# Patient Record
Sex: Female | Born: 1990 | Race: White | Hispanic: No | Marital: Married | Smoking: Former smoker
Health system: Southern US, Community
[De-identification: ages and names within clinical notes are randomized; demographics above are authoritative.]

## PROBLEM LIST (undated history)

## (undated) DIAGNOSIS — O24419 Gestational diabetes mellitus in pregnancy, unspecified control: Secondary | ICD-10-CM

## (undated) DIAGNOSIS — I10 Essential (primary) hypertension: Secondary | ICD-10-CM

## (undated) DIAGNOSIS — B009 Herpesviral infection, unspecified: Secondary | ICD-10-CM

## (undated) HISTORY — DX: Gestational diabetes mellitus in pregnancy, unspecified control: O24.419

## (undated) HISTORY — PX: CHOLECYSTECTOMY: SHX55

## (undated) HISTORY — DX: Herpesviral infection, unspecified: B00.9

## (undated) HISTORY — PX: TONSILLECTOMY: SUR1361

## (undated) HISTORY — DX: Essential (primary) hypertension: I10

---
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
## 2008-12-06 ENCOUNTER — Emergency Department (HOSPITAL_COMMUNITY): Admission: EM | Admit: 2008-12-06 | Discharge: 2008-12-06 | Payer: Self-pay | Admitting: Family Medicine

## 2010-01-08 ENCOUNTER — Emergency Department (HOSPITAL_COMMUNITY): Admission: EM | Admit: 2010-01-08 | Discharge: 2010-01-08 | Payer: Self-pay | Admitting: Emergency Medicine

## 2010-01-11 ENCOUNTER — Encounter: Admission: RE | Admit: 2010-01-11 | Discharge: 2010-01-11 | Payer: Self-pay | Admitting: Gastroenterology

## 2010-01-16 ENCOUNTER — Ambulatory Visit (HOSPITAL_COMMUNITY): Admission: RE | Admit: 2010-01-16 | Discharge: 2010-01-16 | Payer: Self-pay | Admitting: Gastroenterology

## 2010-08-19 ENCOUNTER — Encounter: Payer: Self-pay | Admitting: Sports Medicine

## 2010-11-06 LAB — CBC
MCHC: 35 g/dL (ref 30.0–36.0)
RDW: 12.4 % (ref 11.5–15.5)

## 2010-11-06 LAB — POCT I-STAT, CHEM 8
Chloride: 108 mEq/L (ref 96–112)
HCT: 44 % (ref 36.0–46.0)
Potassium: 3.8 mEq/L (ref 3.5–5.1)

## 2010-11-06 LAB — POCT URINALYSIS DIP (DEVICE)
Glucose, UA: NEGATIVE mg/dL
Nitrite: NEGATIVE
Urobilinogen, UA: 1 mg/dL (ref 0.0–1.0)
pH: 6.5 (ref 5.0–8.0)

## 2010-11-06 LAB — DIFFERENTIAL
Basophils Absolute: 0 10*3/uL (ref 0.0–0.1)
Basophils Relative: 0 % (ref 0–1)
Neutro Abs: 19.3 10*3/uL — ABNORMAL HIGH (ref 1.7–7.7)
Neutrophils Relative %: 90 % — ABNORMAL HIGH (ref 43–77)

## 2010-12-22 ENCOUNTER — Inpatient Hospital Stay (HOSPITAL_COMMUNITY)
Admission: RE | Admit: 2010-12-22 | Discharge: 2010-12-25 | DRG: 775 | Disposition: A | Discharge status: Home / Self Care | Payer: Medicaid Other | Source: Ambulatory Visit | Attending: Obstetrics and Gynecology | Admitting: Obstetrics and Gynecology

## 2010-12-22 LAB — CBC
HCT: 40.4 % (ref 36.0–46.0)
Hemoglobin: 13.7 g/dL (ref 12.0–15.0)
RDW: 13.1 % (ref 11.5–15.5)
WBC: 15.3 10*3/uL — ABNORMAL HIGH (ref 4.0–10.5)

## 2010-12-24 LAB — CBC
MCV: 92.6 fL (ref 78.0–100.0)
Platelets: 238 10*3/uL (ref 150–400)
RBC: 3.92 MIL/uL (ref 3.87–5.11)
WBC: 17.1 10*3/uL — ABNORMAL HIGH (ref 4.0–10.5)

## 2011-01-15 ENCOUNTER — Inpatient Hospital Stay (HOSPITAL_COMMUNITY): Admission: RE | Admit: 2011-01-15 | Payer: Self-pay | Source: Ambulatory Visit | Admitting: Obstetrics and Gynecology

## 2011-05-25 IMAGING — NM NM HEPATO W/GB/PHARM/[PERSON_NAME]
2 series · 12 of 12 positions shown · non-contrast
Comparison: Ultrasound 01/11/2010.

CLINICAL DATA: Right upper quadrant abdominal pain and nausea for
6 months.

NUCLEAR MEDICINE HEPATOBILIARY IMAGING WITH GALLBLADDER EF
TECHNIQUE: Sequential images of the abdomen were obtained [DATE]
minutes following intravenous administration of
radiopharmaceutical.  After the slow intravenous infusion of
uCg Cholecystokinin, the gallbladder ejection fraction was
determined.
Radiopharmaceutical:  5.0 mCi Gc-77m Choletec

[he hepatobiliary · 3.43mm/px · 6 of 30 frames shown (1 of 2)]
[frame 3/30]
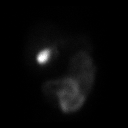
[frame 8/30]
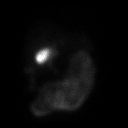
[frame 13/30]
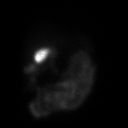
[frame 18/30]
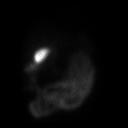
[frame 23/30]
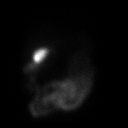
[frame 28/30]
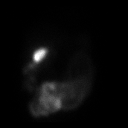

[he hepatobiliary · 3.43mm/px · 6 of 52 frames shown (2 of 2)]
[frame 5/52]
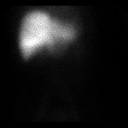
[frame 13/52]
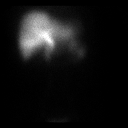
[frame 22/52]
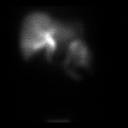
[frame 31/52]
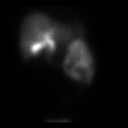
[frame 39/52]
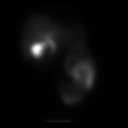
[frame 48/52]
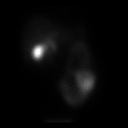

[12 of 12 positions shown; findings below may reference images not displayed]

FINDINGS: Initial images demonstrate homogenous hepatic activity
and prompt visualization of the gallbladder and biliary system.

The stimulated portion of the study demonstrates limited if any
gallbladder contraction there is progressive small bowel activity.
The gallbladder ejection fraction calculated at approximately 30
minutes is 4%.  Normal ejection fractions are considered greater
than 30%.

The patient did not experience symptoms  during CCK administration.
IMPRESSION: 1.  The cystic and common bile ducts are patent.
2.  Limited gallbladder contraction with CCK.  Gallbladder ejection
fraction is 4%.

## 2012-05-04 ENCOUNTER — Emergency Department (HOSPITAL_COMMUNITY)
Admission: EM | Admit: 2012-05-04 | Discharge: 2012-05-04 | Disposition: A | Discharge status: Home / Self Care | Payer: Self-pay | Source: Home / Self Care | Attending: Emergency Medicine | Admitting: Emergency Medicine

## 2012-05-04 ENCOUNTER — Encounter (HOSPITAL_COMMUNITY): Payer: Self-pay

## 2012-05-04 DIAGNOSIS — M549 Dorsalgia, unspecified: Secondary | ICD-10-CM
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
## 2012-05-06 NOTE — ED Provider Notes (Signed)
Medical screening examination/treatment/procedure(s) were performed by non-physician practitioner and as supervising physician I was immediately available for consultation/collaboration.  Marbeth Smedley, M.D.   Shatora Weatherbee C Rajean Desantiago, MD 05/06/12 0747 

## 2018-10-12 ENCOUNTER — Other Ambulatory Visit (HOSPITAL_COMMUNITY)
Admission: RE | Admit: 2018-10-12 | Discharge: 2018-10-12 | Disposition: A | Discharge status: Home / Self Care | Payer: 59 | Source: Ambulatory Visit | Attending: Family Medicine | Admitting: Family Medicine

## 2018-10-12 ENCOUNTER — Other Ambulatory Visit: Payer: Self-pay

## 2018-10-12 ENCOUNTER — Encounter: Payer: Self-pay | Admitting: Family Medicine

## 2018-10-12 ENCOUNTER — Ambulatory Visit (INDEPENDENT_AMBULATORY_CARE_PROVIDER_SITE_OTHER): Payer: 59 | Admitting: Family Medicine

## 2018-10-12 VITALS — BP 133/84 | HR 93 | Ht 64.0 in | Wt 188.6 lb

## 2018-10-12 DIAGNOSIS — Z01419 Encounter for gynecological examination (general) (routine) without abnormal findings: Secondary | ICD-10-CM

## 2018-10-12 DIAGNOSIS — T8332XA Displacement of intrauterine contraceptive device, initial encounter: Secondary | ICD-10-CM

## 2018-10-12 NOTE — Progress Notes (Signed)
    GYNECOLOGY ANNUAL PREVENTATIVE CARE ENCOUNTER NOTE  Subjective:   Meghan Phelps is a 28 y.o. G53P1001 female here for a routine annual gynecologic exam.  Current complaints: none.   Denies abnormal vaginal bleeding, discharge, pelvic pain, problems with intercourse or other gynecologic concerns.    Gynecologic History No LMP recorded (exact date). (Menstrual status: IUD). Contraception: IUD Last Pap: 3 yrs ago- WNL per reports Last mammogram: @40 -50  The following portions of the patient's history were reviewed and updated as appropriate: allergies, current medications, past family history, past medical history, past social history, past surgical history and problem list.  Review of Systems Pertinent items are noted in HPI.   Objective:  BP 133/84 (BP Location: Left Arm, Patient Position: Sitting, Cuff Size: Normal)   Pulse 93   Ht 5\' 4"  (1.626 m)   Wt 188 lb 9.6 oz (85.5 kg)   LMP  (Exact Date)   BMI 32.37 kg/m  CONSTITUTIONAL: Well-developed, well-nourished female in no acute distress.  HENT:  Normocephalic, atraumatic,  Oropharynx is clear and moist EYES:  No scleral icterus.  NECK: Normal range of motion, supple, no masses.  Normal thyroid.  SKIN: Skin is warm and dry. No rash noted. Not diaphoretic. No erythema. No pallor. NEUROLOGIC: Alert and oriented to person, place, and time. Normal reflexes, muscle tone coordination. No cranial nerve deficit noted. PSYCHIATRIC: Normal mood and affect. Normal behavior. Normal judgment and thought content. CARDIOVASCULAR: Normal heart rate noted. 2+ distal pulses. RESPIRATORY: normal WOB. BREASTS: Symmetric in size. No masses, skin changes, nipple drainage, or lymphadenopathy. ABDOMEN: Soft,  no distention noted.  No tenderness, rebound or guarding.  Breast are widely spaced and minimal glandular tissue palpated.  PELVIC: Normal appearing external genitalia; normal appearing vaginal mucosa and cervix.  No abnormal discharge noted.   Pap smear obtained.  Normal uterine size, no other palpable masses, no uterine or adnexal tenderness. Unable to see IUD strings and unable to catch/feel with cytobrush MUSCULOSKELETAL: Normal range of motion.     Assessment and Plan:  1) Annual gynecologic examination with pap smear:  Will follow up results of pap smear and manage accordingly. Declines STI screen.  Routine preventative health maintenance measures emphasized.  2) Contraception counseling: Reviewed all forms of birth control options available including abstinence; over the counter/barrier methods; hormonal contraceptive medication including pill, patch, ring, injection,contraceptive implant; hormonal and nonhormonal IUDs; permanent sterilization options including vasectomy and the various tubal sterilization modalities. Risks and benefits reviewed.  Questions were answered.  Written information was also given to the patient to review.  Patient desires IUD- will continue with this method. Due for removal in 2021. Due to inability to see or feel strings [place] ordered to confirm normal placement. She will follow up in  4-6 wks for [place] and then 1 yr for surveillance.  She was told to call with any further questions, or with any concerns about this method of contraception.  Emphasized use of condoms 100% of the time for STI prevention.   Please refer to After Visit Summary for other counseling recommendations.   Return in about 1 year (around 10/12/2019) for Yearly wellness exam.  Federico Flake, MD, MPH, ABFM Attending Physician Faculty Practice- [facility]

## 2018-10-16 LAB — CYTOLOGY - PAP
Diagnosis: UNDETERMINED — AB
HPV: NOT DETECTED

## 2018-11-05 ENCOUNTER — Other Ambulatory Visit: Payer: Self-pay

## 2018-11-05 ENCOUNTER — Ambulatory Visit (INDEPENDENT_AMBULATORY_CARE_PROVIDER_SITE_OTHER): Payer: 59 | Admitting: Adult Health

## 2018-11-05 ENCOUNTER — Telehealth: Payer: Self-pay | Admitting: Obstetrics & Gynecology

## 2018-11-05 ENCOUNTER — Encounter: Payer: Self-pay | Admitting: Adult Health

## 2018-11-05 VITALS — BP 130/89 | HR 107 | Temp 99.2°F | Ht 64.0 in | Wt 191.0 lb

## 2018-11-05 DIAGNOSIS — B009 Herpesviral infection, unspecified: Secondary | ICD-10-CM | POA: Diagnosis not present

## 2018-11-05 DIAGNOSIS — N9089 Other specified noninflammatory disorders of vulva and perineum: Secondary | ICD-10-CM | POA: Diagnosis not present

## 2018-11-05 MED ORDER — VALACYCLOVIR HCL 1 G PO TABS: 1000.0000 mg | ORAL_TABLET | Freq: Two times a day (BID) | ORAL | 3 refills | Status: DC

## 2018-11-05 MED ORDER — LIDOCAINE 5 % EX OINT
1.0000 | TOPICAL_OINTMENT | CUTANEOUS | 0 refills | Status: DC | PRN
Start: 2018-11-05 — End: 2020-01-05

## 2018-11-05 NOTE — Progress Notes (Signed)
Patient ID: Meghan Phelps, female   DOB: May 20, 1991, 28 y.o.   MRN: [id number] History of Present Illness:  [name] is a 28 year old white female, G1P1, single, worked in with complaints of pain in vaginal area, swollen and raw, and ?hair bumps. Feels achy and has low grade temp. She has Googled her symptoms and thinks it may be herpes. She works at [employer]. No PCP.  Current Medications, Allergies, Past Medical History, Past Surgical History, Family History and Social History were reviewed in [name] record.     Review of Systems: Has pain in vaginal area that about 3 days now and swoleeln and raw, ?hair bumps  Hurts to sit and bend Fells achy and has low grade temp, denies cough, shortness of breath or chest pain.    Physical Exam:BP 130/89 (BP Location: Left Arm, Patient Position: Sitting, Cuff Size: Normal)   Pulse (!) 107   Temp 99.2 F (37.3 C)   Ht 5\' 4"  (1.626 m)   Wt 191 lb (86.6 kg)   BMI 32.79 kg/m  General:  Well developed, well nourished, no acute distress Skin:  Warm and dry Pelvic:  External genitalia is has some folliculitis on mons pubis and lymph nodes are swollen in groin bilaterally.She has vesicles on right labia, with swelling, HSV culture obtained.  The vagina is normal in appearance . Urethra has no lesions or masses. The cervix is bulbous. No IUD strings seen (she has appt 4/28 for [place] to check location) Uterus is felt to be normal size, shape, and contour.  No adnexal masses, mildly tender.Bladder is non tender, no masses felt. Psych:  No mood changes, alert and cooperative,teary today Fall risk is low.  Examination chaperoned by Marchelle Folks Rash LPN. Discussed herpes, will rx valtrex 1 gm bid for 10 days,use peri bottle with warm water when peeing.  Impression: 1. Vulvar irritation -Use peri bottle with warm water whne peeing -wear loose pants at home  -try lidocaine ointment  - Herpes simplex virus culture sent  2. Herpes - Herpes  simplex virus culture sent  Meds ordered this encounter  Medications  . valACYclovir (VALTREX) 1000 MG tablet    Sig: Take 1 tablet (1,000 mg total) by mouth 2 (two) times daily.    Dispense:  20 tablet    Refill:  3    Order Specific Question:   Supervising Provider    Answer:   Despina Hidden, LUTHER H [[id number]]  . lidocaine (XYLOCAINE) 5 % ointment    Sig: Apply 1 application topically as needed.    Dispense:  35.44 g    Refill:  0    Order Specific Question:   Supervising Provider    Answer:   Lazaro Arms [[id number]]     Plan: HSV culture sent Given handout on herpes by Krames too Note given to return to work 11/26/2018 Has appt 4/28 for [place] to check IUD location

## 2018-11-05 NOTE — Telephone Encounter (Signed)
Pt is wanting an appt to be screened for herpes. Please advise.

## 2018-11-05 NOTE — Patient Instructions (Signed)
Genital Herpes °Genital herpes is a common sexually transmitted infection (STI) that is caused by a virus. The virus spreads from person to person through sexual contact. Infection can cause itching, blisters, and sores around the genitals or rectum. Symptoms may last several days and then go away This is called an outbreak. However, the virus remains in your body, so you may have more outbreaks in the future. The time between outbreaks varies and can be months or years. °Genital herpes affects men and women. It is particularly concerning for pregnant women because the virus can be passed to the baby during delivery and can cause serious problems. Genital herpes is also a concern for people who have a weak disease-fighting (immune) system. °What are the causes? °This condition is caused by the herpes simplex virus (HSV) type 1 or type 2. The virus may spread through: °· Sexual contact with an infected person, including vaginal, anal, and oral sex. °· Contact with fluid from a herpes sore. °· The skin. This means that you can get herpes from an infected partner even if he or she does not have a visible sore or does not know that he or she is infected. °What increases the risk? °You are more likely to develop this condition if: °· You have sex with many partners. °· You do not use latex condoms during sex. °What are the signs or symptoms? °Most people do not have symptoms (asymptomatic) or have mild symptoms that may be mistaken for other skin problems. Symptoms may include: °· Small red bumps near the genitals, rectum, or mouth. These bumps turn into blisters and then turn into sores. °· Flu-like symptoms, including: °? Fever. °? Body aches. °? Swollen lymph nodes. °? Headache. °· Painful urination. °· Pain and itching in the genital area or rectal area. °· Vaginal discharge. °· Tingling or shooting pain in the legs and buttocks. °Generally, symptoms are more severe and last longer during the first (primary)  outbreak. Flu-like symptoms are also more common during the primary outbreak. °How is this diagnosed? °Genital herpes may be diagnosed based on: °· A physical exam. °· Your medical history. °· Blood tests. °· A test of a fluid sample (culture) from an open sore. °How is this treated? °There is no cure for this condition, but treatment with antiviral medicines that are taken by mouth (orally) can do the following: °· Speed up healing and relieve symptoms. °· Help to reduce the spread of the virus to sexual partners. °· Limit the chance of future outbreaks, or make future outbreaks shorter. °· Lessen symptoms of future outbreaks. °Your health care provider may also recommend pain relief medicines, such as aspirin or ibuprofen. °Follow these instructions at home: °Sexual activity °· Do not have sexual contact during active outbreaks. °· Practice safe sex. Latex condoms and female condoms may help prevent the spread of the herpes virus. °General instructions °· Keep the affected areas dry and clean. °· Take over-the-counter and prescription medicines only as told by your health care provider. °· Avoid rubbing or touching blisters and sores. If you do touch blisters or sores: °? Wash your hands thoroughly with soap and water. °? Do not touch your eyes afterward. °· To help relieve pain or itching, you may take the following actions as directed by your health care provider: °? Apply a cold, wet cloth (cold compress) to affected areas 4-6 times a day. °? Apply a substance that protects your skin and reduces bleeding (astringent). °? Apply a gel that   helps relieve pain around sores (lidocaine gel). °? Take a warm, shallow bath that cleans the genital area (sitz bath). °· Keep all follow-up visits as told by your health care provider. This is important. °How is this prevented? °· Use condoms. Although anyone can get genital herpes during sexual contact, even with the use of a condom, a condom can provide some  protection. °· Avoid having multiple sexual partners. °· Talk with your sexual partner about any symptoms either of you may have. Also, talk with your partner about any history of STIs. °· Get tested for STIs before you have sex. Ask your partner to do the same. °· Do not have sexual contact if you have symptoms of genital herpes. °Contact a health care provider if: °· Your symptoms are not improving with medicine. °· Your symptoms return. °· You have new symptoms. °· You have a fever. °· You have abdominal pain. °· You have redness, swelling, or pain in your eye. °· You notice new sores on other parts of your body. °· You are a woman and experience bleeding between menstrual periods. °· You have had herpes and you become pregnant or plan to become pregnant. °Summary °· Genital herpes is a common sexually transmitted infection (STI) that is caused by the herpes simplex virus (HSV) type 1 or type 2. °· These viruses are most often spread through sexual contact with an infected person. °· You are more likely to develop this condition if you have sex with many partners or you have unprotected sex. °· Most people do not have symptoms (asymptomatic) or have mild symptoms that may be mistaken for other skin problems. Symptoms occur as outbreaks that may happen months or years apart. °· There is no cure for this condition, but treatment with oral antiviral medicines can reduce symptoms, reduce the chance of spreading the virus to a partner, prevent future outbreaks, or shorten future outbreaks. °This information is not intended to replace advice given to you by your health care provider. Make sure you discuss any questions you have with your health care provider. °Document Released: 07/12/2000 Document Revised: 06/14/2016 Document Reviewed: 06/14/2016 °Elsevier Interactive Patient Education © 2019 [employer] ° °

## 2018-11-05 NOTE — Telephone Encounter (Signed)
Pt thinks she is having hsv outbreak. Scheduled appt for 1045.

## 2018-11-08 LAB — HERPES SIMPLEX VIRUS CULTURE

## 2018-11-09 ENCOUNTER — Encounter: Payer: Self-pay | Admitting: Adult Health

## 2018-11-09 DIAGNOSIS — B009 Herpesviral infection, unspecified: Secondary | ICD-10-CM

## 2018-11-09 HISTORY — DX: Herpesviral infection, unspecified: B00.9

## 2018-11-23 ENCOUNTER — Other Ambulatory Visit: Payer: Self-pay | Admitting: Family Medicine

## 2018-11-23 DIAGNOSIS — T8332XA Displacement of intrauterine contraceptive device, initial encounter: Secondary | ICD-10-CM

## 2018-11-24 ENCOUNTER — Other Ambulatory Visit: Payer: Self-pay

## 2018-11-24 ENCOUNTER — Ambulatory Visit (INDEPENDENT_AMBULATORY_CARE_PROVIDER_SITE_OTHER): Payer: 59

## 2018-11-24 DIAGNOSIS — Z30430 Encounter for insertion of intrauterine contraceptive device: Secondary | ICD-10-CM

## 2018-11-24 DIAGNOSIS — T8332XA Displacement of intrauterine contraceptive device, initial encounter: Secondary | ICD-10-CM | POA: Diagnosis not present

## 2018-11-24 NOTE — Progress Notes (Signed)
PELVIC US TA/TV: homogeneous anteverted uterus,wnl,IUD is centrally located within the endometrium,EEC 4.8 mm,normal ovaries bilat,ovaries appear mobile,no pain during ultrasound

## 2019-01-05 DIAGNOSIS — H5213 Myopia, bilateral: Secondary | ICD-10-CM | POA: Diagnosis not present

## 2019-01-05 DIAGNOSIS — H52222 Regular astigmatism, left eye: Secondary | ICD-10-CM | POA: Diagnosis not present

## 2020-01-05 ENCOUNTER — Other Ambulatory Visit (HOSPITAL_COMMUNITY)
Admission: RE | Admit: 2020-01-05 | Discharge: 2020-01-05 | Disposition: A | Discharge status: Home / Self Care | Payer: 59 | Source: Ambulatory Visit | Attending: Adult Health | Admitting: Adult Health

## 2020-01-05 ENCOUNTER — Encounter: Payer: Self-pay | Admitting: Adult Health

## 2020-01-05 ENCOUNTER — Ambulatory Visit (INDEPENDENT_AMBULATORY_CARE_PROVIDER_SITE_OTHER): Payer: 59 | Admitting: Adult Health

## 2020-01-05 VITALS — BP 138/83 | HR 91 | Ht 64.0 in | Wt 190.5 lb

## 2020-01-05 DIAGNOSIS — Z01419 Encounter for gynecological examination (general) (routine) without abnormal findings: Secondary | ICD-10-CM

## 2020-01-05 DIAGNOSIS — Z3202 Encounter for pregnancy test, result negative: Secondary | ICD-10-CM | POA: Insufficient documentation

## 2020-01-05 DIAGNOSIS — Z1272 Encounter for screening for malignant neoplasm of vagina: Secondary | ICD-10-CM | POA: Diagnosis not present

## 2020-01-05 DIAGNOSIS — Z30432 Encounter for removal of intrauterine contraceptive device: Secondary | ICD-10-CM | POA: Diagnosis not present

## 2020-01-05 DIAGNOSIS — Z30011 Encounter for initial prescription of contraceptive pills: Secondary | ICD-10-CM | POA: Insufficient documentation

## 2020-01-05 LAB — POCT URINE PREGNANCY: Preg Test, Ur: NEGATIVE

## 2020-01-05 MED ORDER — NORETHIN ACE-ETH ESTRAD-FE 1-20 MG-MCG PO TABS: 1.0000 | ORAL_TABLET | Freq: Every day | ORAL | 11 refills | Status: DC

## 2020-01-05 NOTE — Progress Notes (Signed)
Patient ID: Meghan Phelps, female   DOB: 05-27-1991, 29 y.o.   MRN: [id number] History of Present Illness: Meghan Phelps is a 29 year old white female, engaged, in for well woman gyn exam and pap.Her last pap was ASCUS, negative HPV. She wants her IUD removed, she is getting married in October, she wants OCs for now.   Current Medications, Allergies, Past Medical History, Past Surgical History, Family History and Social History were reviewed in Meghan Phelps.     Review of Systems: Patient denies any headaches, hearing loss, fatigue, blurred vision, shortness of breath, chest pain, abdominal pain, problems with bowel movements, urination, or intercourse. No joint pain or mood swings. and pap.Her last pap was ASCUS, negative HPV. She wants her IUD removed, she is getting married in October, she wants OCs for now.   Current Medications, Allergies, Past Medical History, Past Surgical History, Family History and Social History were reviewed in Meghan Phelps.     Review of Systems: Patient denies any headaches, hearing loss, fatigue, blurred vision, shortness of breath, chest pain, abdominal pain, problems with bowel movements, urination, or intercourse. No joint pain or mood swings.    Physical Exam:BP 138/83 (BP Location: Left Arm, Patient Position: Sitting, Cuff Size: Normal)   Pulse 91   Ht 5\' 4"  (1.626 m)   Wt 190 lb 8 oz (86.4 kg)   BMI 32.70 kg/m UPT is negative.Conset signed for IUD removal. General:  Well developed, well nourished, no acute distress Skin:  Warm and dry Neck:  Midline trachea, normal thyroid, good ROM, no lymphadenopathy Lungs; Clear to auscultation bilaterally Breast:  No dominant palpable mass, retraction, or nipple discharge Cardiovascular: Regular rate and rhythm Abdomen:  Soft, non tender, no hepatosplenomegaly Pelvic:  External genitalia is normal in appearance, no lesions.  The vagina is normal in appearance. Urethra has no lesions or masses. The cervix is bulbous. Pap with high risk HPV 16/18 genotyping performed, no IUD strings seen, used forceps to fish to find strings and IUD removed. Uterus is felt to be normal size, shape, and contour.  No adnexal masses or tenderness noted.Bladder is non tender, no masses felt. Extremities/musculoskeletal:  No swelling or varicosities noted, no clubbing or cyanosis Psych:  No mood changes, alert and cooperative,seems happy AA 2 Fall risk is low PHQ 9 score is 6, no SI  Impression and Plan: 1.  Encounter for gynecological examination with Papanicolaou smear of cervix Pap sent Physical in 1 year   2. Pregnancy examination or test, negative result  3. Encounter for IUD removal Had cramping after removal, given water and snack, take advil or tylenol for cramping  4. Encounter for initial prescription of contraceptive pills Will rx junel 1/20, start today, use condoms for 1 pack Meds ordered this encounter  Medications  . norethindrone-ethinyl estradiol (LOESTRIN FE) 1-20 MG-MCG tablet    Sig: Take 1 tablet by mouth daily.    Dispense:  1 Package    Refill:  11    Order Specific Question:   Supervising Provider    Answer:   2/20 H [[id number]]   Follow up in 3 months for ROS and BP check

## 2020-01-07 LAB — CYTOLOGY - PAP
Chlamydia: NEGATIVE
Comment: NEGATIVE
Comment: NEGATIVE
Comment: NORMAL
Diagnosis: REACTIVE
High risk HPV: NEGATIVE
Neisseria Gonorrhea: NEGATIVE

## 2020-01-11 DIAGNOSIS — H5213 Myopia, bilateral: Secondary | ICD-10-CM | POA: Diagnosis not present

## 2020-01-11 DIAGNOSIS — H52222 Regular astigmatism, left eye: Secondary | ICD-10-CM | POA: Diagnosis not present

## 2020-04-06 ENCOUNTER — Ambulatory Visit (INDEPENDENT_AMBULATORY_CARE_PROVIDER_SITE_OTHER): Payer: 59 | Admitting: Adult Health

## 2020-04-06 ENCOUNTER — Encounter: Payer: Self-pay | Admitting: Adult Health

## 2020-04-06 VITALS — BP 134/89 | HR 101 | Ht 64.0 in | Wt 187.0 lb

## 2020-04-06 DIAGNOSIS — Z3041 Encounter for surveillance of contraceptive pills: Secondary | ICD-10-CM

## 2020-04-06 DIAGNOSIS — R030 Elevated blood-pressure reading, without diagnosis of hypertension: Secondary | ICD-10-CM | POA: Diagnosis not present

## 2020-04-06 NOTE — Progress Notes (Signed)
  Subjective:     Patient ID: Meghan Phelps, female   DOB: 26-Jun-1991, 29 y.o.   MRN: [id number]  HPI Meghan Phelps is a 29 year old white female, engaged, getting married next month in for BP check and ROS on starting OCs after IUD removal. She is Charity fundraiser at [employer].  Review of Systems Periods monthly on OCs, first was long, second better, then the third lasted 4 days stopped, then cramped and had clots and bleed through tampon. Has weeding stress  Reviewed past medical,surgical, social and family history. Reviewed medications and allergies.     Objective:   Physical Exam BP 134/89 (BP Location: Right Arm, Patient Position: Sitting, Cuff Size: Normal)   Pulse (!) 101   Ht 5\' 4"  (1.626 m)   Wt 187 lb (84.8 kg)   BMI 32.10 kg/m   Skin warm and dry. Lungs: clear to ausculation bilaterally. Cardiovascular: regular rate and rhythm.     Upstream - 04/06/20 0933      Pregnancy Intention Screening   Does the patient want to become pregnant in the next year? Unsure    Does the patient's partner want to become pregnant in the next year? Unsure    Would the patient like to discuss contraceptive options today? No      Contraception Wrap Up   Current Method Oral Contraceptive    End Method Oral Contraceptive    Contraception Counseling Provided No          Assessment:     1. Encounter for surveillance of contraceptive pills Continue loestrin 1/20  2. Elevated BP without diagnosis of hypertension Keep check on BP, follow up in 3 months     Plan:     Follow up in 3 months

## 2020-07-06 ENCOUNTER — Ambulatory Visit (INDEPENDENT_AMBULATORY_CARE_PROVIDER_SITE_OTHER): Payer: 59 | Admitting: Adult Health

## 2020-07-06 ENCOUNTER — Other Ambulatory Visit: Payer: Self-pay

## 2020-07-06 ENCOUNTER — Encounter: Payer: Self-pay | Admitting: Adult Health

## 2020-07-06 VITALS — BP 140/96 | HR 102 | Ht 64.0 in | Wt 193.0 lb

## 2020-07-06 DIAGNOSIS — I10 Essential (primary) hypertension: Secondary | ICD-10-CM | POA: Insufficient documentation

## 2020-07-06 DIAGNOSIS — F17200 Nicotine dependence, unspecified, uncomplicated: Secondary | ICD-10-CM | POA: Insufficient documentation

## 2020-07-06 DIAGNOSIS — Z319 Encounter for procreative management, unspecified: Secondary | ICD-10-CM | POA: Diagnosis not present

## 2020-07-06 MED ORDER — PRENATAL PLUS 27-1 MG PO TABS: 1.0000 | ORAL_TABLET | Freq: Every day | ORAL | 12 refills | Status: DC

## 2020-07-06 MED ORDER — AMLODIPINE BESYLATE 5 MG PO TABS: 5.0000 mg | ORAL_TABLET | Freq: Every day | ORAL | 3 refills | Status: DC

## 2020-07-06 NOTE — Patient Instructions (Signed)
DASH Eating Plan DASH stands for "Dietary Approaches to Stop Hypertension." The DASH eating plan is a healthy eating plan that has been shown to reduce high blood pressure (hypertension). It may also reduce your risk for type 2 diabetes, heart disease, and stroke. The DASH eating plan may also help with weight loss. What are tips for following this plan?  General guidelines  Avoid eating more than 2,300 mg (milligrams) of salt (sodium) a day. If you have hypertension, you may need to reduce your sodium intake to 1,500 mg a day.  Limit alcohol intake to no more than 1 drink a day for nonpregnant women and 2 drinks a day for men. One drink equals 12 oz of beer, 5 oz of wine, or 1 oz of hard liquor.  Work with your health care provider to maintain a healthy body weight or to lose weight. Ask what an ideal weight is for you.  Get at least 30 minutes of exercise that causes your heart to beat faster (aerobic exercise) most days of the week. Activities may include walking, swimming, or biking.  Work with your health care provider or diet and nutrition specialist (dietitian) to adjust your eating plan to your individual calorie needs. Reading food labels   Check food labels for the amount of sodium per serving. Choose foods with less than 5 percent of the Daily Value of sodium. Generally, foods with less than 300 mg of sodium per serving fit into this eating plan.  To find whole grains, look for the word "whole" as the first word in the ingredient list. Shopping  Buy products labeled as "low-sodium" or "no salt added."  Buy fresh foods. Avoid canned foods and premade or frozen meals. Cooking  Avoid adding salt when cooking. Use salt-free seasonings or herbs instead of table salt or sea salt. Check with your health care provider or pharmacist before using salt substitutes.  Do not fry foods. Cook foods using healthy methods such as baking, boiling, grilling, and broiling instead.  Cook with  heart-healthy oils, such as olive, canola, soybean, or sunflower oil. Meal planning  Eat a balanced diet that includes: ? 5 or more servings of fruits and vegetables each day. At each meal, try to fill half of your plate with fruits and vegetables. ? Up to 6-8 servings of whole grains each day. ? Less than 6 oz of lean meat, poultry, or fish each day. A 3-oz serving of meat is about the same size as a deck of cards. One egg equals 1 oz. ? 2 servings of low-fat dairy each day. ? A serving of nuts, seeds, or beans 5 times each week. ? Heart-healthy fats. Healthy fats called Omega-3 fatty acids are found in foods such as flaxseeds and coldwater fish, like sardines, salmon, and mackerel.  Limit how much you eat of the following: ? Canned or prepackaged foods. ? Food that is high in trans fat, such as fried foods. ? Food that is high in saturated fat, such as fatty meat. ? Sweets, desserts, sugary drinks, and other foods with added sugar. ? Full-fat dairy products.  Do not salt foods before eating.  Try to eat at least 2 vegetarian meals each week.  Eat more home-cooked food and less restaurant, buffet, and fast food.  When eating at a restaurant, ask that your food be prepared with less salt or no salt, if possible. What foods are recommended? The items listed may not be a complete list. Talk with your dietitian about   what dietary choices are best for you. Grains Whole-grain or whole-wheat bread. Whole-grain or whole-wheat pasta. Brown rice. Oatmeal. Quinoa. Bulgur. Whole-grain and low-sodium cereals. Pita bread. Low-fat, low-sodium crackers. Whole-wheat flour tortillas. Vegetables Fresh or frozen vegetables (raw, steamed, roasted, or grilled). Low-sodium or reduced-sodium tomato and vegetable juice. Low-sodium or reduced-sodium tomato sauce and tomato paste. Low-sodium or reduced-sodium canned vegetables. Fruits All fresh, dried, or frozen fruit. Canned fruit in natural juice (without  added sugar). Meat and other protein foods Skinless chicken or turkey. Ground chicken or turkey. Pork with fat trimmed off. Fish and seafood. Egg whites. Dried beans, peas, or lentils. Unsalted nuts, nut butters, and seeds. Unsalted canned beans. Lean cuts of beef with fat trimmed off. Low-sodium, lean deli meat. Dairy Low-fat (1%) or fat-free (skim) milk. Fat-free, low-fat, or reduced-fat cheeses. Nonfat, low-sodium ricotta or cottage cheese. Low-fat or nonfat yogurt. Low-fat, low-sodium cheese. Fats and oils Soft margarine without trans fats. Vegetable oil. Low-fat, reduced-fat, or light mayonnaise and salad dressings (reduced-sodium). Canola, safflower, olive, soybean, and sunflower oils. Avocado. Seasoning and other foods Herbs. Spices. Seasoning mixes without salt. Unsalted popcorn and pretzels. Fat-free sweets. What foods are not recommended? The items listed may not be a complete list. Talk with your dietitian about what dietary choices are best for you. Grains Baked goods made with fat, such as croissants, muffins, or some breads. Dry pasta or rice meal packs. Vegetables Creamed or fried vegetables. Vegetables in a cheese sauce. Regular canned vegetables (not low-sodium or reduced-sodium). Regular canned tomato sauce and paste (not low-sodium or reduced-sodium). Regular tomato and vegetable juice (not low-sodium or reduced-sodium). Pickles. Olives. Fruits Canned fruit in a light or heavy syrup. Fried fruit. Fruit in cream or butter sauce. Meat and other protein foods Fatty cuts of meat. Ribs. Fried meat. Bacon. Sausage. Bologna and other processed lunch meats. Salami. Fatback. Hotdogs. Bratwurst. Salted nuts and seeds. Canned beans with added salt. Canned or smoked fish. Whole eggs or egg yolks. Chicken or turkey with skin. Dairy Whole or 2% milk, cream, and half-and-half. Whole or full-fat cream cheese. Whole-fat or sweetened yogurt. Full-fat cheese. Nondairy creamers. Whipped toppings.  Processed cheese and cheese spreads. Fats and oils Butter. Stick margarine. Lard. Shortening. Ghee. Bacon fat. Tropical oils, such as coconut, palm kernel, or palm oil. Seasoning and other foods Salted popcorn and pretzels. Onion salt, garlic salt, seasoned salt, table salt, and sea salt. Worcestershire sauce. Tartar sauce. Barbecue sauce. Teriyaki sauce. Soy sauce, including reduced-sodium. Steak sauce. Canned and packaged gravies. Fish sauce. Oyster sauce. Cocktail sauce. Horseradish that you find on the shelf. Ketchup. Mustard. Meat flavorings and tenderizers. Bouillon cubes. Hot sauce and Tabasco sauce. Premade or packaged marinades. Premade or packaged taco seasonings. Relishes. Regular salad dressings. Where to find more information:  National Heart, Lung, and Blood Institute: www.nhlbi.nih.gov  American Heart Association: www.heart.org Summary  The DASH eating plan is a healthy eating plan that has been shown to reduce high blood pressure (hypertension). It may also reduce your risk for type 2 diabetes, heart disease, and stroke.  With the DASH eating plan, you should limit salt (sodium) intake to 2,300 mg a day. If you have hypertension, you may need to reduce your sodium intake to 1,500 mg a day.  When on the DASH eating plan, aim to eat more fresh fruits and vegetables, whole grains, lean proteins, low-fat dairy, and heart-healthy fats.  Work with your health care provider or diet and nutrition specialist (dietitian) to adjust your eating plan to your   individual calorie needs. This information is not intended to replace advice given to you by your health care provider. Make sure you discuss any questions you have with your health care provider. Document Revised: 06/27/2017 Document Reviewed: 07/08/2016 Elsevier Patient Education  2020 Elsevier Inc.  

## 2020-07-06 NOTE — Progress Notes (Signed)
  Subjective:     Patient ID: Meghan Phelps, female   DOB: Sep 29, 1990, 29 y.o.   MRN: [id number]  HPI Meghan Phelps is a 29 year old white female,married, in for BP check, she stopped OCs end of October and would lie to get pregnant. She has gotten both COVID vaccines.  Review of Systems Patient denies any headaches, hearing loss, fatigue, blurred vision, shortness of breath, chest pain, abdominal pain, problems with bowel movements, urination, or intercourse. No joint pain or mood swings. Reviewed past medical,surgical, social and family history. Reviewed medications and allergies.     Objective:   Physical Exam BP (!) 140/96 (BP Location: Right Arm, Cuff Size: Normal)   Pulse (!) 102   Ht 5\' 4"  (1.626 m)   Wt 193 lb (87.5 kg)   LMP 06/14/2020   BMI 33.13 kg/m  Skin warm and dry. . Lungs: clear to ausculation bilaterally. Cardiovascular: regular rate and rhythm.     Upstream - 07/06/20 14/09/21      Pregnancy Intention Screening   Does the patient want to become pregnant in the next year? Yes    Does the patient's partner want to become pregnant in the next year? Yes    Would the patient like to discuss contraceptive options today? No      Contraception Wrap Up   Current Method Pregnant/Seeking Pregnancy    End Method Pregnant/Seeking Pregnancy    Contraception Counseling Provided No          Assessment:     1. Hypertension, unspecified type Will rx Norvasc Meds ordered this encounter  Medications  . prenatal vitamin w/FE, FA (PRENATAL 1 + 1) 27-1 MG TABS tablet    Sig: Take 1 tablet by mouth daily at 12 noon.    Dispense:  30 tablet    Refill:  12    Order Specific Question:   Supervising Provider    Answer:   [id number], LUTHER H [[id number]]  . amLODipine (NORVASC) 5 MG tablet    Sig: Take 1 tablet (5 mg total) by mouth daily.    Dispense:  30 tablet    Refill:  3    Order Specific Question:   Supervising Provider    Answer:   Despina Hidden H [[id number]]  Try to lose about 10 lbs Review  DASH diet  Keep check on BP at work   2. Patient desires pregnancy Will rx PNV Will try to get BP better before getting pregnant  3. Smoker Start trying to decrease     Plan:     Follow up in 8 weeks for BP and ROS

## 2020-09-01 ENCOUNTER — Other Ambulatory Visit: Payer: Self-pay

## 2020-09-01 ENCOUNTER — Encounter: Payer: Self-pay | Admitting: Adult Health

## 2020-09-01 ENCOUNTER — Ambulatory Visit (INDEPENDENT_AMBULATORY_CARE_PROVIDER_SITE_OTHER): Payer: 59 | Admitting: Adult Health

## 2020-09-01 VITALS — BP 128/84 | HR 95 | Ht 64.0 in | Wt 182.5 lb

## 2020-09-01 DIAGNOSIS — I10 Essential (primary) hypertension: Secondary | ICD-10-CM

## 2020-09-01 DIAGNOSIS — Z319 Encounter for procreative management, unspecified: Secondary | ICD-10-CM | POA: Diagnosis not present

## 2020-09-01 MED ORDER — AMLODIPINE BESYLATE 5 MG PO TABS: 5.0000 mg | ORAL_TABLET | Freq: Every day | ORAL | 6 refills | Status: DC

## 2020-09-01 NOTE — Progress Notes (Signed)
  Subjective:     Patient ID: Meghan Phelps, female   DOB: March 22, 1991, 30 y.o.   MRN: [id number]  HPI Meghan Phelps is a 30 year old white female, married, G1P1 in for BP check since starting Norvasc, in December. She has bee eating healthier and so has husband and has started decreasing smoking.   Review of Systems  Has lost some weight  Has some discomfort right groin area at times since end of December Reviewed past medical,surgical, social and family history. Reviewed medications and allergies.     Objective:   Physical Exam BP 128/84 (BP Location: Left Arm, Patient Position: Sitting, Cuff Size: Normal)   Pulse 95   Ht 5\' 4"  (1.626 m)   Wt 182 lb 8 oz (82.8 kg)   LMP 08/31/2020   BMI 31.33 kg/m  Skin warm and dry.  Lungs: clear to ausculation bilaterally. Cardiovascular: regular rate and rhythm. Has lost 10 1/2 lbs.   Fall risk is low  Upstream - 09/01/20 0857      Pregnancy Intention Screening   Does the patient want to become pregnant in the next year? Yes    Does the patient's partner want to become pregnant in the next year? Yes    Would the patient like to discuss contraceptive options today? No      Contraception Wrap Up   Current Method Pregnant/Seeking Pregnancy    End Method Pregnant/Seeking Pregnancy    Contraception Counseling Provided No          Assessment:     1. Hypertension, unspecified type,much better now Continue norvasc Meds ordered this encounter  Medications  . amLODipine (NORVASC) 5 MG tablet    Sig: Take 1 tablet (5 mg total) by mouth daily.    Dispense:  30 tablet    Refill:  6    Order Specific Question:   Supervising Provider    Answer:   10/30/20, LUTHER H [[id number]]    2. Patient desires pregnancy Continue PNV,has refills Continue weight loss efforts Continue decreasing smoking    Plan:     Return in June for physical or sooner if pregnant

## 2020-09-29 ENCOUNTER — Encounter: Payer: Self-pay | Admitting: *Deleted

## 2020-09-29 ENCOUNTER — Other Ambulatory Visit: Payer: Self-pay

## 2020-09-29 ENCOUNTER — Ambulatory Visit (INDEPENDENT_AMBULATORY_CARE_PROVIDER_SITE_OTHER): Payer: 59 | Admitting: *Deleted

## 2020-09-29 VITALS — BP 129/79 | HR 87 | Ht 64.0 in | Wt 183.0 lb

## 2020-09-29 DIAGNOSIS — Z3201 Encounter for pregnancy test, result positive: Secondary | ICD-10-CM

## 2020-09-29 LAB — POCT URINE PREGNANCY: Preg Test, Ur: POSITIVE — AB

## 2020-09-29 NOTE — Progress Notes (Signed)
Chart reviewed for nurse visit. Agree with plan of care.  Adline Potter, NP 09/29/2020 1:23 PM

## 2020-09-29 NOTE — Progress Notes (Signed)
   NURSE VISIT- PREGNANCY CONFIRMATION   SUBJECTIVE:  Meghan Phelps is a 30 y.o. G61P1001 female at [redacted]w[redacted]d by certain LMP of Patient's last menstrual period was 08/31/2020. Here for pregnancy confirmation.  Home pregnancy test: positive x 2  She reports no complaints.  She is taking prenatal vitamins.    OBJECTIVE:  BP 129/79 (BP Location: Left Arm, Patient Position: Sitting, Cuff Size: Normal)   Pulse 87   Ht 5\' 4"  (1.626 m)   Wt 183 lb (83 kg)   LMP 08/31/2020   BMI 31.41 kg/m   Appears well, in no apparent distress OB History  Gravida Para Term Preterm AB Living  2 1 1     1   SAB IAB Ectopic Multiple Live Births          1    # Outcome Date GA Lbr Len/2nd Weight Sex Delivery Anes PTL Lv  2 Current           1 Term 12/23/10 [redacted]w[redacted]d   M Vag-Spont   LIV    No results found for this or any previous visit (from the past 24 hour(s)).  ASSESSMENT: Positive pregnancy test, [redacted]w[redacted]d by LMP    PLAN: Schedule for dating ultrasound in 5 weeks Prenatal vitamins: continue   Nausea medicines: not currently needed   OB packet given: Yes  [redacted]w[redacted]d  09/29/2020 12:12 PM

## 2020-10-20 ENCOUNTER — Other Ambulatory Visit: Payer: Self-pay | Admitting: Adult Health

## 2020-10-20 DIAGNOSIS — O209 Hemorrhage in early pregnancy, unspecified: Secondary | ICD-10-CM

## 2020-10-20 NOTE — Progress Notes (Signed)
Will check QHCG as baseline and ABORH

## 2020-10-21 LAB — BETA HCG QUANT (REF LAB): hCG Quant: 2419 m[IU]/mL

## 2020-10-21 LAB — ABO/RH: Rh Factor: POSITIVE

## 2020-10-23 ENCOUNTER — Other Ambulatory Visit: Payer: Self-pay | Admitting: Adult Health

## 2020-10-23 DIAGNOSIS — O209 Hemorrhage in early pregnancy, unspecified: Secondary | ICD-10-CM

## 2020-10-23 NOTE — Progress Notes (Signed)
Ck QHCG today  

## 2020-10-24 LAB — BETA HCG QUANT (REF LAB): hCG Quant: 3206 m[IU]/mL

## 2020-10-25 ENCOUNTER — Telehealth: Payer: Self-pay | Admitting: Adult Health

## 2020-10-25 DIAGNOSIS — O209 Hemorrhage in early pregnancy, unspecified: Secondary | ICD-10-CM

## 2020-10-25 NOTE — Addendum Note (Signed)
Addended by: Moss Mc on: 10/25/2020 11:52 AM   Modules accepted: Orders

## 2020-10-25 NOTE — Telephone Encounter (Signed)
Patient called stating that she is early pregnant and has been bleeding for 12 days already, patient states that she has been speaking with Victorino Dike and she told  Her to find something sooner here in the office but there is none available. Patient would like to know if we could schedule something at the hospital sometime this week. Please contact pt

## 2020-10-26 ENCOUNTER — Ambulatory Visit (HOSPITAL_COMMUNITY)
Admission: RE | Admit: 2020-10-26 | Discharge: 2020-10-26 | Disposition: A | Discharge status: Home / Self Care | Payer: 59 | Source: Ambulatory Visit | Attending: Adult Health | Admitting: Adult Health

## 2020-10-26 ENCOUNTER — Other Ambulatory Visit: Payer: Self-pay

## 2020-10-26 DIAGNOSIS — O209 Hemorrhage in early pregnancy, unspecified: Secondary | ICD-10-CM | POA: Diagnosis not present

## 2020-10-26 DIAGNOSIS — Z3A01 Less than 8 weeks gestation of pregnancy: Secondary | ICD-10-CM | POA: Diagnosis not present

## 2020-10-26 DIAGNOSIS — O208 Other hemorrhage in early pregnancy: Secondary | ICD-10-CM | POA: Diagnosis not present

## 2020-10-27 ENCOUNTER — Telehealth: Payer: Self-pay | Admitting: Adult Health

## 2020-10-27 NOTE — Telephone Encounter (Signed)
Patient called and wanted to inform us that she had an ultrasound at [facility] on 10/26/2020 and was told to scheduled a follow-up Ultrasound in 14 days at our practice. Please advise..Clinical staff will follow up with patient.

## 2020-10-31 ENCOUNTER — Other Ambulatory Visit: Payer: 59

## 2020-11-03 ENCOUNTER — Other Ambulatory Visit: Payer: 59

## 2020-11-03 ENCOUNTER — Other Ambulatory Visit: Payer: Self-pay | Admitting: Adult Health

## 2020-11-03 DIAGNOSIS — O3680X0 Pregnancy with inconclusive fetal viability, not applicable or unspecified: Secondary | ICD-10-CM

## 2020-11-06 ENCOUNTER — Encounter: Payer: Self-pay | Admitting: Adult Health

## 2020-11-06 ENCOUNTER — Ambulatory Visit (INDEPENDENT_AMBULATORY_CARE_PROVIDER_SITE_OTHER): Payer: 59 | Admitting: Adult Health

## 2020-11-06 ENCOUNTER — Encounter: Payer: 59 | Admitting: Adult Health

## 2020-11-06 ENCOUNTER — Other Ambulatory Visit: Payer: Self-pay

## 2020-11-06 ENCOUNTER — Ambulatory Visit (INDEPENDENT_AMBULATORY_CARE_PROVIDER_SITE_OTHER): Payer: 59

## 2020-11-06 ENCOUNTER — Other Ambulatory Visit: Payer: Self-pay | Admitting: Adult Health

## 2020-11-06 VITALS — BP 140/88 | HR 102 | Ht 64.0 in | Wt 186.0 lb

## 2020-11-06 DIAGNOSIS — Z3A09 9 weeks gestation of pregnancy: Secondary | ICD-10-CM

## 2020-11-06 DIAGNOSIS — O3680X0 Pregnancy with inconclusive fetal viability, not applicable or unspecified: Secondary | ICD-10-CM

## 2020-11-06 DIAGNOSIS — O034 Incomplete spontaneous abortion without complication: Secondary | ICD-10-CM

## 2020-11-06 MED ORDER — MISOPROSTOL 200 MCG PO TABS: ORAL_TABLET | ORAL | 1 refills | Status: DC

## 2020-11-06 NOTE — Progress Notes (Signed)
US TV: 6+2 wks GS no fetal pole or YS,GS 14.5 mm,normal ovaries,anechoic collection in right adnexa seen on previous outside ultrasound appears to be a vein,it elongates and appears to have slow venous flow,no free fluid,no pain during ultrasound

## 2020-11-06 NOTE — Progress Notes (Signed)
  Subjective:     Patient ID: Meghan Phelps, female   DOB: [birth year], 30 y.o.   MRN: [id number]  HPI Meghan Phelps is a 30 year old white female, married, in for [place] has been bleeding 24 days, and had [place] 10/26/20 that showed empty GS 5+6 weeks with small [facility] and ? Area right adnexa. Blood type is O+. She is Charity fundraiser at [employer].   Review of Systems +bleeding Reviewed past medical,surgical, social and family history. Reviewed medications and allergies.     Objective:   Physical Exam BP 140/88 (BP Location: Left Arm, Patient Position: Sitting, Cuff Size: Normal)   Pulse (!) 102   Ht 5\' 4"  (1.626 m)   Wt 186 lb (84.4 kg)   LMP 08/31/2020   BMI 31.93 kg/m  Skin warm and dry. Lungs: clear to ausculation bilaterally. Cardiovascular: regular rate and rhythm. 10/29/2020 showed GS 14.5 mm no YS no fetal pole.   Fall risk is low  Upstream - 11/06/20 01/06/21      Pregnancy Intention Screening   Does the patient want to become pregnant in the next year? N/A    Does the patient's partner want to become pregnant in the next year? N/A    Would the patient like to discuss contraceptive options today? No      Contraception Wrap Up   Current Method Pregnant/Seeking Pregnancy    End Method Pregnant/Seeking Pregnancy    Contraception Counseling Provided No          Assessment:     1. Incomplete miscarriage Discussed options and will proceed with Cytotec Meds ordered this encounter  Medications  . misoprostol (CYTOTEC) 200 MCG tablet    Sig: Place 4 in vagina at bedtime, may repeat in 48 hours    Dispense:  4 tablet    Refill:  1    Order Specific Question:   Supervising Provider    Answer:   [id number] H [[id number]]     Can use Advil and tylenol for pain Continue PNV  Plan:     Follow up in 2 weeks or sooner if needed

## 2020-11-07 NOTE — Progress Notes (Signed)
This encounter was created in error - please disregard.

## 2020-11-15 ENCOUNTER — Encounter: Payer: 59 | Admitting: Adult Health

## 2020-11-15 ENCOUNTER — Ambulatory Visit: Payer: 59 | Admitting: Adult Health

## 2020-11-16 ENCOUNTER — Other Ambulatory Visit: Payer: 59

## 2020-11-21 ENCOUNTER — Encounter: Payer: Self-pay | Admitting: Adult Health

## 2020-11-21 ENCOUNTER — Other Ambulatory Visit: Payer: Self-pay

## 2020-11-21 ENCOUNTER — Ambulatory Visit (INDEPENDENT_AMBULATORY_CARE_PROVIDER_SITE_OTHER): Payer: 59 | Admitting: Adult Health

## 2020-11-21 VITALS — BP 134/86 | HR 105 | Ht 64.0 in | Wt 191.0 lb

## 2020-11-21 DIAGNOSIS — O039 Complete or unspecified spontaneous abortion without complication: Secondary | ICD-10-CM | POA: Diagnosis not present

## 2020-11-21 NOTE — Progress Notes (Signed)
  Subjective:     Patient ID: Meghan Phelps, female   DOB: June 12, 1991, 30 y.o.   MRN: [id number]  HPI Meghan Phelps is 30 year old white female, married, back in follow up after taking Cytotec, she took it twice, she said the bleeding was not that much, and bleed till 4/18 stopped 4 days and started back light. On 4/11 had US showing anembryonic pregnancy. QHCG was 3206 on 10/23/20. Blood type is O+.  Review of Systems Bleeding light Reviewed past medical,surgical, social and family history. Reviewed medications and allergies.     Objective:   Physical Exam BP 134/86 (BP Location: Left Arm, Patient Position: Sitting, Cuff Size: Normal)   Pulse (!) 105   Ht 5\' 4"  (1.626 m)   Wt 191 lb (86.6 kg)   LMP 08/31/2020 (Exact Date)   Breastfeeding No   BMI 32.79 kg/m  Skin warm and dry. Lungs: clear to ausculation bilaterally. Cardiovascular: regular rate and rhythm.     Upstream - 11/21/20 1136      Pregnancy Intention Screening   Does the patient want to become pregnant in the next year? Yes    Does the patient's partner want to become pregnant in the next year? Yes    Would the patient like to discuss contraceptive options today? No      Contraception Wrap Up   Current Method Pregnant/Seeking Pregnancy    End Method Pregnant/Seeking Pregnancy    Contraception Counseling Provided No          Assessment:     1. Miscarriage Check [facility]    Plan:     We will talk when labs back Continue PNV

## 2020-11-22 ENCOUNTER — Other Ambulatory Visit: Payer: Self-pay | Admitting: Adult Health

## 2020-11-22 DIAGNOSIS — O039 Complete or unspecified spontaneous abortion without complication: Secondary | ICD-10-CM

## 2020-11-22 LAB — BETA HCG QUANT (REF LAB): hCG Quant: 177 m[IU]/mL

## 2020-12-05 DIAGNOSIS — O039 Complete or unspecified spontaneous abortion without complication: Secondary | ICD-10-CM | POA: Diagnosis not present

## 2020-12-06 ENCOUNTER — Other Ambulatory Visit: Payer: Self-pay | Admitting: Adult Health

## 2020-12-06 DIAGNOSIS — O039 Complete or unspecified spontaneous abortion without complication: Secondary | ICD-10-CM

## 2020-12-06 LAB — BETA HCG QUANT (REF LAB): hCG Quant: 11 m[IU]/mL

## 2020-12-19 DIAGNOSIS — O039 Complete or unspecified spontaneous abortion without complication: Secondary | ICD-10-CM | POA: Diagnosis not present

## 2020-12-20 LAB — BETA HCG QUANT (REF LAB): hCG Quant: 2 m[IU]/mL

## 2020-12-29 ENCOUNTER — Other Ambulatory Visit: Payer: Self-pay

## 2020-12-29 ENCOUNTER — Ambulatory Visit
Admission: EM | Admit: 2020-12-29 | Discharge: 2020-12-29 | Disposition: A | Discharge status: Home / Self Care | Payer: 59 | Attending: Emergency Medicine | Admitting: Emergency Medicine

## 2020-12-29 ENCOUNTER — Encounter: Payer: Self-pay | Admitting: Emergency Medicine

## 2020-12-29 DIAGNOSIS — Z20822 Contact with and (suspected) exposure to covid-19: Secondary | ICD-10-CM | POA: Insufficient documentation

## 2020-12-29 DIAGNOSIS — J029 Acute pharyngitis, unspecified: Secondary | ICD-10-CM | POA: Diagnosis not present

## 2020-12-29 LAB — POCT RAPID STREP A (OFFICE): Rapid Strep A Screen: NEGATIVE

## 2020-12-29 MED ORDER — IBUPROFEN 600 MG PO TABS: 600.0000 mg | ORAL_TABLET | Freq: Four times a day (QID) | ORAL | 0 refills | Status: DC | PRN

## 2020-12-29 NOTE — Discharge Instructions (Addendum)
your rapid strep was negative today, so we have sent off a throat culture.  We will contact you and call in the appropriate antibiotics if your culture comes back positive for an infection requiring antibiotic treatment.  Give [place] a working phone number.  1 gram of Tylenol and 600 mg ibuprofen together 3-4 times a day as needed for pain.  Make sure you drink plenty of extra fluids.  Some people find salt water gargles and  Traditional Medicinal's "Throat Coat" tea helpful. Take 5 mL of liquid Benadryl and 5 mL of Maalox. Mix it together, and then hold it in your mouth for as long as you can and then swallow. You may do this 4 times a day.    COVID will be back in a day or 2. You will be a candidate for Paxlovid.  I am checking kidney function today.  Go to www.goodrx.com  or www.costplusdrugs.com to look up your medications. This will give you a list of where you can find your prescriptions at the most affordable prices. Or ask the pharmacist what the cash price is, or if they have any other discount programs available to help make your medication more affordable. This can be less expensive than what you would pay with insurance.

## 2020-12-29 NOTE — ED Provider Notes (Signed)
HPI  SUBJECTIVE:  Patient reports sore throat starting yesterday. Sx worse with swallowing.  Sx better with nothing. Has not tried anything for this. No fever + Questionable "White patches" on tonsils   No neck stiffness  No Cough No nasal congestion, rhinorrhea No Myalgias No Headache No Rash  No loss of taste or smell No shortness of breath or difficulty breathing No nausea, vomiting No diarrhea No abdominal pain     No Recent COVID exposure Son currently has strep. No reflux sxs No Allergy sxs  No Breathing difficulty, voice changes, sensation of throat swelling shut No Drooling No Trismus No abx in past month.  No antipyretic in past 4-6 hrs Past medical history of hypertension. LMP: Had negative pregnancy test last week PMD: None    Past Medical History:  Diagnosis Date  . Herpes 11/09/2018   Type 1 on culture   . Hypertension     Past Surgical History:  Procedure Laterality Date  . CHOLECYSTECTOMY      Family History  Problem Relation Age of Onset  . Hypertension Maternal Grandmother     Social History   Tobacco Use  . Smoking status: Former Smoker    Packs/day: 0.50    Types: Cigarettes  . Smokeless tobacco: Never Used  Vaping Use  . Vaping Use: Never used  Substance Use Topics  . Alcohol use: No  . Drug use: No    No current facility-administered medications for this encounter.  Current Outpatient Medications:  .  ibuprofen (ADVIL) 600 MG tablet, Take 1 tablet (600 mg total) by mouth every 6 (six) hours as needed., Disp: 30 tablet, Rfl: 0 .  amLODipine (NORVASC) 5 MG tablet, Take 1 tablet (5 mg total) by mouth daily., Disp: 30 tablet, Rfl: 6 .  prenatal vitamin w/FE, FA (PRENATAL 1 + 1) 27-1 MG TABS tablet, Take 1 tablet by mouth daily at 12 noon., Disp: 30 tablet, Rfl: 12  No Known Allergies   ROS  As noted in HPI.   Physical Exam  BP 139/86 (BP Location: Right Arm)   Pulse 95   Temp 98.7 F (37.1 C) (Oral)   Resp 16    LMP 08/31/2020   SpO2 98%   Constitutional: Well developed, well nourished, no acute distress Eyes:  EOMI, conjunctiva normal bilaterally HENT: Normocephalic, atraumatic,mucus membranes moist. - nasal congestion + erythematous oropharynx  - enlarged tonsils +  exudates. Uvula midline.  Respiratory: Normal inspiratory effort Cardiovascular: Normal rate, no murmurs, rubs, gallops GI: nondistended, nontender. No appreciable splenomegaly skin: No rash, skin intact Lymph: - Anterior cervical LN.  No posterior cervical lymphadenopathy Musculoskeletal: no deformities Neurologic: Alert & oriented x 3, no focal neuro deficits Psychiatric: Speech and behavior appropriate.  ED Course   Medications - No data to display  Orders Placed This Encounter  Procedures  . Covid-19, Flu A+B ([facility])    Standing Status:   Standing    Number of Occurrences:   1  . Culture, group A strep    Standing Status:   Standing    Number of Occurrences:   1  . Basic metabolic panel    Standing Status:   Standing    Number of Occurrences:   1  . POCT rapid strep A    Standing Status:   Standing    Number of Occurrences:   1    Results for orders placed or performed during the hospital encounter of 12/29/20 (from the past 24 hour(s))  POCT rapid strep  A     Status: None   Collection Time: 12/29/20  9:39 AM  Result Value Ref Range   Rapid Strep A Screen Negative Negative   No results found.  ED Clinical Impression  1. Exudative pharyngitis   2. Exposure to COVID-19 virus   3. Encounter for laboratory testing for COVID-19 virus      ED Assessment/Plan  Checking COVID/flu, BMP.  She will be a candidate for Paxlovid due to BMI above 30 and history of hypertension, Tamiflu if flu is positive  Flu, COVID pending at the time of signing of this note  Rapid strep negative. Obtaining throat culture to guide antibiotic treatment. Discussed this with patient. We'll contact her if culture is positive, and  will call in Appropriate antibiotics. Patient home with ibuprofen, Tylenol, Benadryl/Maalox mixture. Patient to followup with PMD of choice when necessary, will refer to local primary care resources.  Discussed labs,  MDM, plan and followup with patient. Discussed sn/sx that should prompt return to the ED. patient agrees with plan.   Meds ordered this encounter  Medications  . ibuprofen (ADVIL) 600 MG tablet    Sig: Take 1 tablet (600 mg total) by mouth every 6 (six) hours as needed.    Dispense:  30 tablet    Refill:  0     *This clinic note was created using Scientist, clinical (histocompatibility and immunogenetics). Therefore, there may be occasional mistakes despite careful proofreading.     Domenick Gong, MD 12/30/20 [phone]

## 2020-12-29 NOTE — ED Triage Notes (Signed)
Sore throat since yesterday.

## 2020-12-31 LAB — COVID-19, FLU A+B NAA
Influenza A, NAA: NOT DETECTED
Influenza B, NAA: NOT DETECTED
SARS-CoV-2, NAA: NOT DETECTED

## 2021-01-01 LAB — CULTURE, GROUP A STREP (THRC)

## 2021-01-02 LAB — BASIC METABOLIC PANEL
BUN/Creatinine Ratio: 20 (ref 9–23)
BUN: 13 mg/dL (ref 6–20)
CO2: 18 mmol/L — ABNORMAL LOW (ref 20–29)
Calcium: 10.7 mg/dL — ABNORMAL HIGH (ref 8.7–10.2)
Chloride: 103 mmol/L (ref 96–106)
Creatinine, Ser: 0.64 mg/dL (ref 0.57–1.00)
Glucose: 82 mg/dL (ref 65–99)
Potassium: 4.3 mmol/L (ref 3.5–5.2)
Sodium: 139 mmol/L (ref 134–144)
eGFR: 121 mL/min/{1.73_m2} (ref >59)

## 2021-01-02 LAB — SPECIMEN STATUS REPORT

## 2021-01-15 ENCOUNTER — Other Ambulatory Visit: Payer: Self-pay

## 2021-01-15 ENCOUNTER — Encounter: Payer: Self-pay | Admitting: Adult Health

## 2021-01-15 ENCOUNTER — Ambulatory Visit (INDEPENDENT_AMBULATORY_CARE_PROVIDER_SITE_OTHER): Payer: 59 | Admitting: Adult Health

## 2021-01-15 ENCOUNTER — Encounter (HOSPITAL_COMMUNITY): Payer: Self-pay

## 2021-01-15 VITALS — BP 134/94 | HR 105 | Ht 64.0 in | Wt 191.0 lb

## 2021-01-15 DIAGNOSIS — Z319 Encounter for procreative management, unspecified: Secondary | ICD-10-CM | POA: Diagnosis not present

## 2021-01-15 DIAGNOSIS — Z01419 Encounter for gynecological examination (general) (routine) without abnormal findings: Secondary | ICD-10-CM

## 2021-01-15 NOTE — Progress Notes (Signed)
Patient ID: Meghan Phelps, female   DOB: 04-Sep-1990, 30 y.o.   MRN: [id number] History of Present Illness: Meghan Phelps is a 30 year old white female,married, G2P1001, in for well woman gyn exam, she had miscarriage in April, and wants to be pregnant again. Lab Results  Component Value Date   DIAGPAP  01/05/2020    - Benign reactive/reparative changes associated with intrauterine device   DIAGPAP (IUD) 01/05/2020   HPV NOT DETECTED 10/12/2018   HPVHIGH Negative 01/05/2020      Current Medications, Allergies, Past Medical History, Past Surgical History, Family History and Social History were reviewed in [name] record.     Review of Systems: Patient denies any headaches, hearing loss, fatigue, blurred vision, shortness of breath, chest pain, abdominal pain, problems with bowel movements, urination, or intercourse. No joint pain or mood swings.     Physical Exam:BP (!) 134/94 (BP Location: Right Arm, Patient Position: Sitting, Cuff Size: Normal)   Pulse (!) 105   Ht 5\' 4"  (1.626 m)   Wt 191 lb (86.6 kg)   LMP 01/04/2021 (Exact Date)   Breastfeeding No   BMI 32.79 kg/m   General:  Well developed, well nourished, no acute distress Skin:  Warm and dry,has acne on her chest Neck:  Midline trachea, normal thyroid, good ROM, no lymphadenopathy Lungs; Clear to auscultation bilaterally Breast:  No dominant palpable mass, retraction, or nipple discharge Cardiovascular: Regular rate and rhythm Abdomen:  Soft, non tender, no hepatosplenomegaly Pelvic:  External genitalia is normal in appearance, no lesions.  The vagina is normal in appearance. Urethra has no lesions or masses. The cervix is bulbous.  Uterus is felt to be normal size, shape, and contour.  No adnexal masses or tenderness noted.Bladder is non tender, no masses felt. Extremities/musculoskeletal:  No swelling or varicosities noted, no clubbing or cyanosis Psych:  No mood changes, alert and cooperative,seems  happy  AA is 2  Fall risk is low Depression screen [facility] 2/9 01/15/2021 01/05/2020  Decreased Interest 1 1  Down, Depressed, Hopeless 0 1  PHQ - 2 Score 1 2  Altered sleeping 1 2  Tired, decreased energy 1 1  Change in appetite 1 0  Feeling bad or failure about yourself  0 1  Trouble concentrating 0 0  Moving slowly or fidgety/restless 0 0  Suicidal thoughts 0 0  PHQ-9 Score 4 6  Difficult doing work/chores - Not difficult at all    GAD 7 : Generalized Anxiety Score 01/15/2021 01/05/2020  Nervous, Anxious, on Edge 0 1  Control/stop worrying 0 1  Worry too much - different things 1 1  Trouble relaxing 0 1  Restless 0 1  Easily annoyed or irritable 1 1  Afraid - awful might happen 0 0  Total GAD 7 Score 2 6  Anxiety Difficulty - Not difficult at all      Upstream - 01/15/21 01/17/21       Pregnancy Intention Screening   Does the patient want to become pregnant in the next year? Yes    Does the patient's partner want to become pregnant in the next year? Yes    Would the patient like to discuss contraceptive options today? No      Contraception Wrap Up   Current Method Pregnant/Seeking Pregnancy    End Method Pregnant/Seeking Pregnancy    Contraception Counseling Provided No            Examination chaperoned by [id number] RN  Impression and Plan: 1.  Encounter for well woman exam with routine gynecological exam Physical in 1 year Pap in 2024   2. Patient desires pregnancy Take PNV Discussed timing of sex

## 2021-01-26 DIAGNOSIS — H52222 Regular astigmatism, left eye: Secondary | ICD-10-CM | POA: Diagnosis not present

## 2021-01-26 DIAGNOSIS — H5213 Myopia, bilateral: Secondary | ICD-10-CM | POA: Diagnosis not present

## 2021-03-30 ENCOUNTER — Other Ambulatory Visit: Payer: Self-pay

## 2021-03-30 ENCOUNTER — Ambulatory Visit (INDEPENDENT_AMBULATORY_CARE_PROVIDER_SITE_OTHER): Payer: 59 | Admitting: *Deleted

## 2021-03-30 VITALS — BP 135/89 | HR 109 | Ht 64.0 in | Wt 195.0 lb

## 2021-03-30 DIAGNOSIS — Z3201 Encounter for pregnancy test, result positive: Secondary | ICD-10-CM

## 2021-03-30 DIAGNOSIS — Z3490 Encounter for supervision of normal pregnancy, unspecified, unspecified trimester: Secondary | ICD-10-CM | POA: Diagnosis not present

## 2021-03-30 DIAGNOSIS — N926 Irregular menstruation, unspecified: Secondary | ICD-10-CM

## 2021-03-30 LAB — POCT URINE PREGNANCY: Preg Test, Ur: POSITIVE — AB

## 2021-03-30 NOTE — Progress Notes (Signed)
   NURSE VISIT- PREGNANCY CONFIRMATION   SUBJECTIVE:  Meghan Phelps is a 30 y.o. G51P1001 female at [redacted]w[redacted]d by certain LMP of Patient's last menstrual period was 02/22/2021 (exact date). Here for pregnancy confirmation.  Home pregnancy test: positive x 1   She reports no complaints.  She is taking prenatal vitamins.    OBJECTIVE:  BP 135/89 (BP Location: Right Arm, Patient Position: Sitting, Cuff Size: Normal)   Pulse (!) 109   Ht 5\' 4"  (1.626 m)   Wt 195 lb (88.5 kg)   LMP 02/22/2021 (Exact Date)   BMI 33.47 kg/m   Appears well, in no apparent distress  Results for orders placed or performed in visit on 03/30/21 (from the past 24 hour(s))  POCT urine pregnancy   Collection Time: 03/30/21 10:57 AM  Result Value Ref Range   Preg Test, Ur Positive (A) Negative    ASSESSMENT: Positive pregnancy test, [redacted]w[redacted]d by LMP    PLAN: Schedule for dating ultrasound in 3 weeks Prenatal vitamins: continue   Nausea medicines: not currently needed   OB packet given: Yes  [redacted]w[redacted]d  03/30/2021 10:59 AM

## 2021-03-30 NOTE — Progress Notes (Signed)
Chart reviewed for nurse visit. Agree with plan of care.  Adline Potter, NP 03/30/2021 2:03 PM

## 2021-03-31 LAB — BETA HCG QUANT (REF LAB): hCG Quant: 2283 m[IU]/mL

## 2021-03-31 LAB — PROGESTERONE: Progesterone: 11.4 ng/mL

## 2021-04-05 ENCOUNTER — Other Ambulatory Visit: Payer: Self-pay | Admitting: Adult Health

## 2021-04-20 ENCOUNTER — Other Ambulatory Visit: Payer: Self-pay | Admitting: Obstetrics & Gynecology

## 2021-04-20 ENCOUNTER — Other Ambulatory Visit: Payer: 59

## 2021-04-20 DIAGNOSIS — O3680X0 Pregnancy with inconclusive fetal viability, not applicable or unspecified: Secondary | ICD-10-CM

## 2021-04-23 ENCOUNTER — Other Ambulatory Visit: Payer: Self-pay

## 2021-04-23 ENCOUNTER — Ambulatory Visit (INDEPENDENT_AMBULATORY_CARE_PROVIDER_SITE_OTHER): Payer: 59

## 2021-04-23 DIAGNOSIS — Z3A08 8 weeks gestation of pregnancy: Secondary | ICD-10-CM

## 2021-04-23 DIAGNOSIS — O3680X0 Pregnancy with inconclusive fetal viability, not applicable or unspecified: Secondary | ICD-10-CM | POA: Diagnosis not present

## 2021-04-23 NOTE — Progress Notes (Signed)
[place] 8+4 wks,single IUP,CRL 20.10 mm,FHR 166 bpm,normal ovaries

## 2021-05-07 ENCOUNTER — Other Ambulatory Visit: Payer: Self-pay | Admitting: Adult Health

## 2021-05-07 MED ORDER — PROMETHAZINE HCL 25 MG PO TABS: 25.0000 mg | ORAL_TABLET | Freq: Four times a day (QID) | ORAL | 1 refills | Status: DC | PRN

## 2021-05-07 NOTE — Progress Notes (Signed)
Will rx phenergan for nausea  ?

## 2021-05-21 ENCOUNTER — Other Ambulatory Visit: Payer: Self-pay | Admitting: Obstetrics & Gynecology

## 2021-05-21 DIAGNOSIS — O0990 Supervision of high risk pregnancy, unspecified, unspecified trimester: Secondary | ICD-10-CM | POA: Insufficient documentation

## 2021-05-21 DIAGNOSIS — Z3682 Encounter for antenatal screening for nuchal translucency: Secondary | ICD-10-CM

## 2021-05-22 ENCOUNTER — Encounter: Payer: Self-pay | Admitting: Women's Health

## 2021-05-22 ENCOUNTER — Ambulatory Visit (INDEPENDENT_AMBULATORY_CARE_PROVIDER_SITE_OTHER): Payer: 59

## 2021-05-22 ENCOUNTER — Other Ambulatory Visit: Payer: Self-pay

## 2021-05-22 ENCOUNTER — Ambulatory Visit (INDEPENDENT_AMBULATORY_CARE_PROVIDER_SITE_OTHER): Payer: 59 | Admitting: Women's Health

## 2021-05-22 ENCOUNTER — Ambulatory Visit: Payer: 59 | Admitting: *Deleted

## 2021-05-22 VITALS — BP 121/77 | HR 82 | Wt 185.0 lb

## 2021-05-22 DIAGNOSIS — Z3A12 12 weeks gestation of pregnancy: Secondary | ICD-10-CM | POA: Diagnosis not present

## 2021-05-22 DIAGNOSIS — Z3481 Encounter for supervision of other normal pregnancy, first trimester: Secondary | ICD-10-CM | POA: Diagnosis not present

## 2021-05-22 DIAGNOSIS — O10919 Unspecified pre-existing hypertension complicating pregnancy, unspecified trimester: Secondary | ICD-10-CM

## 2021-05-22 DIAGNOSIS — I10 Essential (primary) hypertension: Secondary | ICD-10-CM

## 2021-05-22 DIAGNOSIS — O0992 Supervision of high risk pregnancy, unspecified, second trimester: Secondary | ICD-10-CM

## 2021-05-22 DIAGNOSIS — O0991 Supervision of high risk pregnancy, unspecified, first trimester: Secondary | ICD-10-CM | POA: Diagnosis not present

## 2021-05-22 DIAGNOSIS — Z8751 Personal history of pre-term labor: Secondary | ICD-10-CM

## 2021-05-22 DIAGNOSIS — Z3682 Encounter for antenatal screening for nuchal translucency: Secondary | ICD-10-CM | POA: Diagnosis not present

## 2021-05-22 DIAGNOSIS — Z31430 Encounter of female for testing for genetic disease carrier status for procreative management: Secondary | ICD-10-CM | POA: Diagnosis not present

## 2021-05-22 LAB — POCT URINALYSIS DIPSTICK OB
Blood, UA: NEGATIVE
Glucose, UA: NEGATIVE
Ketones, UA: NEGATIVE
Leukocytes, UA: NEGATIVE
Nitrite, UA: NEGATIVE
POC,PROTEIN,UA: NEGATIVE

## 2021-05-22 MED ORDER — ASPIRIN 81 MG PO TBEC: 162.0000 mg | DELAYED_RELEASE_TABLET | Freq: Every day | ORAL | 2 refills | Status: DC

## 2021-05-22 NOTE — Patient Instructions (Signed)
Meghan Phelps, thank you for choosing our office today! We appreciate the opportunity to meet your healthcare needs. You may receive a short survey by mail, e-mail, or through [employer]. If you are happy with your care we would appreciate if you could take just a few minutes to complete the survey questions. We read all of your comments and take your feedback very seriously. Thank you again for choosing our office.  Center for [employer] [facility] Team at [facility]  [facility] ([address] [place], [place] [address]) Entrance C, located off of [address] Free 24/7 valet parking   Nausea & Vomiting Have saltine crackers or pretzels by your bed and eat a few bites before you raise your head out of bed in the morning Eat small frequent meals throughout the day instead of large meals Drink plenty of fluids throughout the day to stay hydrated, just don't drink a lot of fluids with your meals.  This can make your stomach fill up faster making you feel sick Do not brush your teeth right after you eat Products with real ginger are good for nausea, like ginger ale and ginger hard candy Make sure it says made with real ginger! Sucking on sour candy like lemon heads is also good for nausea If your prenatal vitamins make you nauseated, take them at night so you will sleep through the nausea Sea Bands If you feel like you need medicine for the nausea & vomiting please let us know If you are unable to keep any fluids or food down please let us know   Constipation Drink plenty of fluid, preferably water, throughout the day Eat foods high in fiber such as fruits, vegetables, and grains Exercise, such as walking, is a good way to keep your bowels regular Drink warm fluids, especially warm prune juice, or decaf coffee Eat a 1/2 cup of real oatmeal (not instant), 1/2 cup applesauce, and 1/2-1 cup warm prune juice every day If needed, you may take Colace (docusate sodium) stool softener  once or twice a day to help keep the stool soft.  If you still are having problems with constipation, you may take Miralax once daily as needed to help keep your bowels regular.   Home Blood Pressure Monitoring for Patients   Your provider has recommended that you check your blood pressure (BP) at least once a week at home. If you do not have a blood pressure cuff at home, one will be provided for you. Contact your provider if you have not received your monitor within 1 week.   Helpful Tips for Accurate Home Blood Pressure Checks  Don't smoke, exercise, or drink caffeine 30 minutes before checking your BP Use the restroom before checking your BP (a full bladder can raise your pressure) Relax in a comfortable upright chair Feet on the ground Left arm resting comfortably on a flat surface at the level of your heart Legs uncrossed Back supported Sit quietly and don't talk Place the cuff on your bare arm Adjust snuggly, so that only two fingertips can fit between your skin and the top of the cuff Check 2 readings separated by at least one minute Keep a log of your BP readings For a visual, please reference this diagram: http://ccnc.care/bpdiagram  Provider Name: [facility]     Phone: [phone]  Zone 1: ALL CLEAR  Continue to monitor your symptoms:  BP reading is less than 140 (top number) or less than 90 (bottom  number)  No right upper stomach pain No headaches or seeing spots No feeling nauseated or throwing up No swelling in face and hands  Zone 2: CAUTION Call your doctor's office for any of the following:  BP reading is greater than 140 (top number) or greater than 90 (bottom number)  Stomach pain under your ribs in the middle or right side Headaches or seeing spots Feeling nauseated or throwing up Swelling in face and hands  Zone 3: EMERGENCY  Seek immediate medical care if you have any of the following:  BP reading is greater than160 (top number) or greater than  110 (bottom number) Severe headaches not improving with Tylenol Serious difficulty catching your breath Any worsening symptoms from Zone 2    First Trimester of Pregnancy The first trimester of pregnancy is from week 1 until the end of week 12 (months 1 through 3). A week after a sperm fertilizes an egg, the egg will implant on the wall of the uterus. This embryo will begin to develop into a baby. Genes from you and your partner are forming the baby. The female genes determine whether the baby is a boy or a girl. At 6-8 weeks, the eyes and face are formed, and the heartbeat can be seen on ultrasound. At the end of 12 weeks, all the baby's organs are formed.  Now that you are pregnant, you will want to do everything you can to have a healthy baby. Two of the most important things are to get good prenatal care and to follow your health care provider's instructions. Prenatal care is all the medical care you receive before the baby's birth. This care will help prevent, find, and treat any problems during the pregnancy and childbirth. BODY CHANGES Your body goes through many changes during pregnancy. The changes vary from woman to woman.  You may gain or lose a couple of pounds at first. You may feel sick to your stomach (nauseous) and throw up (vomit). If the vomiting is uncontrollable, call your health care provider. You may tire easily. You may develop headaches that can be relieved by medicines approved by your health care provider. You may urinate more often. Painful urination may mean you have a bladder infection. You may develop heartburn as a result of your pregnancy. You may develop constipation because certain hormones are causing the muscles that push waste through your intestines to slow down. You may develop hemorrhoids or swollen, bulging veins (varicose veins). Your breasts may begin to grow larger and become tender. Your nipples may stick out more, and the tissue that surrounds them  (areola) may become darker. Your gums may bleed and may be sensitive to brushing and flossing. Dark spots or blotches (chloasma, mask of pregnancy) may develop on your face. This will likely fade after the baby is born. Your menstrual periods will stop. You may have a loss of appetite. You may develop cravings for certain kinds of food. You may have changes in your emotions from day to day, such as being excited to be pregnant or being concerned that something may go wrong with the pregnancy and baby. You may have more vivid and strange dreams. You may have changes in your hair. These can include thickening of your hair, rapid growth, and changes in texture. Some women also have hair loss during or after pregnancy, or hair that feels dry or thin. Your hair will most likely return to normal after your baby is born. WHAT TO EXPECT AT YOUR PRENATAL  VISITS During a routine prenatal visit: You will be weighed to make sure you and the baby are growing normally. Your blood pressure will be taken. Your abdomen will be measured to track your baby's growth. The fetal heartbeat will be listened to starting around week 10 or 12 of your pregnancy. Test results from any previous visits will be discussed. Your health care provider may ask you: How you are feeling. If you are feeling the baby move. If you have had any abnormal symptoms, such as leaking fluid, bleeding, severe headaches, or abdominal cramping. If you have any questions. Other tests that may be performed during your first trimester include: Blood tests to find your blood type and to check for the presence of any previous infections. They will also be used to check for low iron levels (anemia) and Rh antibodies. Later in the pregnancy, blood tests for diabetes will be done along with other tests if problems develop. Urine tests to check for infections, diabetes, or protein in the urine. An ultrasound to confirm the proper growth and development  of the baby. An amniocentesis to check for possible genetic problems. Fetal screens for spina bifida and Down syndrome. You may need other tests to make sure you and the baby are doing well. HOME CARE INSTRUCTIONS  Medicines Follow your health care provider's instructions regarding medicine use. Specific medicines may be either safe or unsafe to take during pregnancy. Take your prenatal vitamins as directed. If you develop constipation, try taking a stool softener if your health care provider approves. Diet Eat regular, well-balanced meals. Choose a variety of foods, such as meat or vegetable-based protein, fish, milk and low-fat dairy products, vegetables, fruits, and whole grain breads and cereals. Your health care provider will help you determine the amount of weight gain that is right for you. Avoid raw meat and uncooked cheese. These carry germs that can cause birth defects in the baby. Eating four or five small meals rather than three large meals a day may help relieve nausea and vomiting. If you start to feel nauseous, eating a few soda crackers can be helpful. Drinking liquids between meals instead of during meals also seems to help nausea and vomiting. If you develop constipation, eat more high-fiber foods, such as fresh vegetables or fruit and whole grains. Drink enough fluids to keep your urine clear or pale yellow. Activity and Exercise Exercise only as directed by your health care provider. Exercising will help you: Control your weight. Stay in shape. Be prepared for labor and delivery. Experiencing pain or cramping in the lower abdomen or low back is a good sign that you should stop exercising. Check with your health care provider before continuing normal exercises. Try to avoid standing for long periods of time. Move your legs often if you must stand in one place for a long time. Avoid heavy lifting. Wear low-heeled shoes, and practice good posture. You may continue to have sex  unless your health care provider directs you otherwise. Relief of Pain or Discomfort Wear a good support bra for breast tenderness.   Take warm sitz baths to soothe any pain or discomfort caused by hemorrhoids. Use hemorrhoid cream if your health care provider approves.   Rest with your legs elevated if you have leg cramps or low back pain. If you develop varicose veins in your legs, wear support hose. Elevate your feet for 15 minutes, 3-4 times a day. Limit salt in your diet. Prenatal Care Schedule your prenatal visits by the  twelfth week of pregnancy. They are usually scheduled monthly at first, then more often in the last 2 months before delivery. Write down your questions. Take them to your prenatal visits. Keep all your prenatal visits as directed by your health care provider. Safety Wear your seat belt at all times when driving. Make a list of emergency phone numbers, including numbers for family, friends, the hospital, and police and fire departments. General Tips Ask your health care provider for a referral to a local prenatal education class. Begin classes no later than at the beginning of month 6 of your pregnancy. Ask for help if you have counseling or nutritional needs during pregnancy. Your health care provider can offer advice or refer you to specialists for help with various needs. Do not use hot tubs, steam rooms, or saunas. Do not douche or use tampons or scented sanitary pads. Do not cross your legs for long periods of time. Avoid cat litter boxes and soil used by cats. These carry germs that can cause birth defects in the baby and possibly loss of the fetus by miscarriage or stillbirth. Avoid all smoking, herbs, alcohol, and medicines not prescribed by your health care provider. Chemicals in these affect the formation and growth of the baby. Schedule a dentist appointment. At home, brush your teeth with a soft toothbrush and be gentle when you floss. SEEK MEDICAL CARE IF:   You have dizziness. You have mild pelvic cramps, pelvic pressure, or nagging pain in the abdominal area. You have persistent nausea, vomiting, or diarrhea. You have a bad smelling vaginal discharge. You have pain with urination. You notice increased swelling in your face, hands, legs, or ankles. SEEK IMMEDIATE MEDICAL CARE IF:  You have a fever. You are leaking fluid from your vagina. You have spotting or bleeding from your vagina. You have severe abdominal cramping or pain. You have rapid weight gain or loss. You vomit blood or material that looks like coffee grounds. You are exposed to [place] measles and have never had them. You are exposed to fifth disease or chickenpox. You develop a severe headache. You have shortness of breath. You have any kind of trauma, such as from a fall or a car accident. Document Released: 07/09/2001 Document Revised: 11/29/2013 Document Reviewed: 05/25/2013 [facility] Patient Information 2015 [place], [place]. This information is not intended to replace advice given to you by your health care provider. Make sure you discuss any questions you have with your health care provider.

## 2021-05-22 NOTE — Progress Notes (Signed)
[place] 12+5 wks,measurements c/w dates,CRL 68 mm,NB present,NT 1.5 mm,normal ovaries,posterior placenta,fhr 150 bpm

## 2021-05-22 NOTE — Progress Notes (Signed)
INITIAL OBSTETRICAL VISIT Patient name: Meghan Phelps MRN [id number]  Date of birth: 1991-06-20 Chief Complaint:   Initial Prenatal Visit  History of Present Illness:   Meghan Phelps is a 30 y.o. G45P1011 Caucasian female at [redacted]w[redacted]d by [place] at 8 weeks with an Estimated Date of Delivery: 11/29/21 being seen today for her initial obstetrical visit.   Patient's last menstrual period was 02/22/2021 (exact date). Her obstetrical history is significant for  36.5wk PTB d/t PPROM, then SAB x 1 .   Today she reports no complaints.  Last pap 01/05/20. Results were: NILM w/ HRHPV negative  Depression screen [facility] 2/9 05/22/2021 01/15/2021 01/05/2020  Decreased Interest 2 1 1   Down, Depressed, Hopeless 1 0 1  PHQ - 2 Score 3 1 2   Altered sleeping 3 1 2   Tired, decreased energy 3 1 1   Change in appetite 3 1 0  Feeling bad or failure about yourself  0 0 1  Trouble concentrating 0 0 0  Moving slowly or fidgety/restless 0 0 0  Suicidal thoughts 0 0 0  PHQ-9 Score 12 4 6   Difficult doing work/chores - - Not difficult at all     GAD 7 : Generalized Anxiety Score 05/22/2021 01/15/2021 01/05/2020  Nervous, Anxious, on Edge 2 0 1  Control/stop worrying 2 0 1  Worry too much - different things 2 1 1   Trouble relaxing 1 0 1  Restless 1 0 1  Easily annoyed or irritable 2 1 1   Afraid - awful might happen 0 0 0  Total GAD 7 Score 10 2 6   Anxiety Difficulty - - Not difficult at all     Review of Systems:   Pertinent items are noted in HPI Denies cramping/contractions, leakage of fluid, vaginal bleeding, abnormal vaginal discharge w/ itching/odor/irritation, headaches, visual changes, shortness of breath, chest pain, abdominal pain, severe nausea/vomiting, or problems with urination or bowel movements unless otherwise stated above.  Pertinent History Reviewed:  Reviewed past medical,surgical, social, obstetrical and family history.  Reviewed problem list, medications and allergies. OB History  Gravida Para Term  Preterm AB Living  3 1 0 1 1 1   SAB IAB Ectopic Multiple Live Births  1       1    # Outcome Date GA Lbr Len/2nd Weight Sex Delivery Anes PTL Lv  3 Current           2 SAB 11/21/20          1 Preterm 12/23/10 [redacted]w[redacted]d  6 lb 3 oz (2.807 kg) M Vag-Spont EPI N LIV   Physical Assessment:   Vitals:   05/22/21 0930  BP: 121/77  Pulse: 82  Weight: 185 lb (83.9 kg)  Body mass index is 31.76 kg/m.       Physical Examination:  General appearance - well appearing, and in no distress  Mental status - alert, oriented to person, place, and time  Psych:  She has a normal mood and affect  Skin - warm and dry, normal color, no suspicious lesions noted  Chest - effort normal, all lung fields clear to auscultation bilaterally  Heart - normal rate and regular rhythm  Abdomen - soft, nontender  Extremities:  No swelling or varicosities noted  Thin prep pap is not done   Chaperone: N/A    TODAY'S NT   03/06/2020 12+5 wks,measurements c/w dates,CRL 68 mm,NB present,NT 1.5 mm,normal ovaries,posterior placenta,fhr 150 bpm  Results for orders placed or performed in visit on 05/22/21 (  from the past 24 hour(s))  POC Urinalysis Dipstick OB   Collection Time: 05/22/21 10:01 AM  Result Value Ref Range   Color, UA     Clarity, UA     Glucose, UA Negative Negative   Bilirubin, UA     Ketones, UA neg    Spec Grav, UA     Blood, UA neg    pH, UA     POC,PROTEIN,UA Negative Negative, Trace, Small (1+), Moderate (2+), Large (3+), 4+   Urobilinogen, UA     Nitrite, UA neg    Leukocytes, UA Negative Negative   Appearance     Odor      Assessment & Plan:  1) High-Risk Pregnancy G3P1011 at [redacted]w[redacted]d with an Estimated Date of Delivery: 11/29/21   2) Initial OB visit  3) CHTN> on Norvasc 5mg  daily, add ASA 162mg  daily, get baseline pre-e labs  4) H/O 36.5wk PTB d/t PPROM> discussed Makena, wants, ordered today, start @ 16wks  Meds:  Meds ordered this encounter  Medications   aspirin 81 MG EC tablet    Sig:  Take 2 tablets (162 mg total) by mouth daily. Swallow whole.    Dispense:  180 tablet    Refill:  2    Order Specific Question:   Supervising Provider    Answer:   H [[id number]]    Initial labs obtained Continue prenatal vitamins Reviewed n/v relief measures and warning s/s to report Reviewed recommended weight gain based on pre-gravid BMI Encouraged well-balanced diet Genetic & carrier screening discussed: requests Panorama, NT/IT, and Horizon  Ultrasound discussed; fetal survey: requested CCNC completed> form faxed if has or is planning to apply for medicaid The nature of for [facility] Lope with multiple MDs and other Advanced Practice Providers was explained to patient; also emphasized that fellows, residents, and students are part of our team. Does have home bp cuff. Office bp cuff given: no. Rx sent: no. Check bp weekly, let [employer] know if consistently >140/90.   Follow-up: Return in about 23 days (around 06/14/2021) for HROB, 2nd IT, Makena, MD or CNM, in person.   Orders Placed This Encounter  Procedures   Urine Culture   GC/Chlamydia Probe Amp   Integrated 1   Protein / creatinine ratio, urine   Pain Management Screening Profile (10S)   Genetic Screening   Comprehensive metabolic panel   CBC/D/Plt+RPR+Rh+ABO+RubIgG...   POC Urinalysis Dipstick OB    [place] CNM, [facility] 05/22/2021 10:24 AM

## 2021-05-23 ENCOUNTER — Encounter: Payer: Self-pay | Admitting: Women's Health

## 2021-05-23 DIAGNOSIS — Z2839 Other underimmunization status: Secondary | ICD-10-CM | POA: Insufficient documentation

## 2021-05-23 LAB — GC/CHLAMYDIA PROBE AMP
Chlamydia trachomatis, NAA: NEGATIVE
Neisseria Gonorrhoeae by PCR: NEGATIVE

## 2021-05-24 LAB — COMPREHENSIVE METABOLIC PANEL
ALT: 9 IU/L (ref 0–32)
AST: 27 IU/L (ref 0–40)
Albumin/Globulin Ratio: 1.7 (ref 1.2–2.2)
Albumin: 4.4 g/dL (ref 3.8–4.8)
Alkaline Phosphatase: 72 IU/L (ref 44–121)
BUN/Creatinine Ratio: 13 (ref 9–23)
BUN: 7 mg/dL (ref 6–20)
Bilirubin Total: 0.2 mg/dL (ref 0.0–1.2)
CO2: 18 mmol/L — ABNORMAL LOW (ref 20–29)
Calcium: 9.5 mg/dL (ref 8.7–10.2)
Chloride: 103 mmol/L (ref 96–106)
Creatinine, Ser: 0.56 mg/dL — ABNORMAL LOW (ref 0.57–1.00)
Globulin, Total: 2.6 g/dL (ref 1.5–4.5)
Glucose: 72 mg/dL (ref 70–99)
Potassium: 4.1 mmol/L (ref 3.5–5.2)
Sodium: 138 mmol/L (ref 134–144)
Total Protein: 7 g/dL (ref 6.0–8.5)
eGFR: 124 mL/min/{1.73_m2} (ref >59)

## 2021-05-24 LAB — CBC/D/PLT+RPR+RH+ABO+RUBIGG...
Antibody Screen: NEGATIVE
Basophils Absolute: 0 10*3/uL (ref 0.0–0.2)
Basos: 0 %
EOS (ABSOLUTE): 0.1 10*3/uL (ref 0.0–0.4)
Eos: 1 %
HCV Ab: <0.1 s/co ratio (ref 0.0–0.9)
HIV Screen 4th Generation wRfx: NONREACTIVE
Hematocrit: 42.3 % (ref 34.0–46.6)
Hemoglobin: 14.6 g/dL (ref 11.1–15.9)
Hepatitis B Surface Ag: NEGATIVE
Immature Grans (Abs): 0 10*3/uL (ref 0.0–0.1)
Immature Granulocytes: 0 %
Lymphocytes Absolute: 2.5 10*3/uL (ref 0.7–3.1)
Lymphs: 18 %
MCH: 31.5 pg (ref 26.6–33.0)
MCHC: 34.5 g/dL (ref 31.5–35.7)
MCV: 91 fL (ref 79–97)
Monocytes Absolute: 0.9 10*3/uL (ref 0.1–0.9)
Monocytes: 6 %
Neutrophils Absolute: 10.3 10*3/uL — ABNORMAL HIGH (ref 1.4–7.0)
Neutrophils: 75 %
Platelets: 371 10*3/uL (ref 150–450)
RBC: 4.64 x10E6/uL (ref 3.77–5.28)
RDW: 11.6 % — ABNORMAL LOW (ref 11.7–15.4)
RPR Ser Ql: NONREACTIVE
Rh Factor: POSITIVE
Rubella Antibodies, IGG: <0.9 index — ABNORMAL LOW (ref >0.99)
WBC: 13.9 10*3/uL — ABNORMAL HIGH (ref 3.4–10.8)

## 2021-05-24 LAB — INTEGRATED 1
Crown Rump Length: 68 mm
Gest. Age on Collection Date: 12.9 weeks
Maternal Age at EDD: 32.7 yr
Nuchal Translucency (NT): 1.5 mm
Number of Fetuses: 1
PAPP-A Value: 1153.9 ng/mL
Weight: 185 [lb_av]

## 2021-05-24 LAB — PMP SCREEN PROFILE (10S), URINE
Amphetamine Scrn, Ur: NEGATIVE ng/mL
BARBITURATE SCREEN URINE: NEGATIVE ng/mL
BENZODIAZEPINE SCREEN, URINE: NEGATIVE ng/mL
CANNABINOIDS UR QL SCN: NEGATIVE ng/mL
Cocaine (Metab) Scrn, Ur: NEGATIVE ng/mL
Creatinine(Crt), U: 69.9 mg/dL (ref 20.0–300.0)
Methadone Screen, Urine: NEGATIVE ng/mL
OXYCODONE+OXYMORPHONE UR QL SCN: NEGATIVE ng/mL
Opiate Scrn, Ur: NEGATIVE ng/mL
Ph of Urine: 7.3 (ref 4.5–8.9)
Phencyclidine Qn, Ur: NEGATIVE ng/mL
Propoxyphene Scrn, Ur: NEGATIVE ng/mL

## 2021-05-24 LAB — PROTEIN / CREATININE RATIO, URINE
Creatinine, Urine: 58.9 mg/dL
Protein, Ur: 7 mg/dL
Protein/Creat Ratio: 119 mg/g creat (ref 0–200)

## 2021-05-24 LAB — HCV INTERPRETATION

## 2021-05-27 LAB — URINE CULTURE

## 2021-05-28 ENCOUNTER — Telehealth: Payer: Self-pay | Admitting: Women's Health

## 2021-05-28 NOTE — Telephone Encounter (Signed)
Rep with insurance called to say that the pt's Makena shots require prior auth Please call [employer] prior auth dept to process [phone]  ?'s call [phone]

## 2021-05-29 ENCOUNTER — Other Ambulatory Visit (HOSPITAL_COMMUNITY): Payer: Self-pay

## 2021-05-29 ENCOUNTER — Other Ambulatory Visit: Payer: Self-pay | Admitting: Women's Health

## 2021-05-29 MED ORDER — HYDROXYPROGESTERONE CAPROATE 275 MG/1.1ML ~~LOC~~ SOAJ
275.0000 mg | SUBCUTANEOUS | 7 refills | Status: DC
  Filled 2021-05-29 – 2021-06-04 (×3): qty 4.4, 28d supply, fill #0
  Filled 2021-07-03 – 2021-07-05 (×3): qty 4.4, 28d supply, fill #1
  Filled ????-??-??: fill #1

## 2021-05-29 MED ORDER — HYDROXYPROGESTERONE CAPROATE 275 MG/1.1ML ~~LOC~~ SOAJ: 275.0000 mg | SUBCUTANEOUS | 7 refills | Status: DC

## 2021-05-29 MED ORDER — HYDROXYPROGESTERONE CAPROATE 275 MG/1.1ML ~~LOC~~ SOAJ
275.0000 mg | SUBCUTANEOUS | 20 refills | Status: DC
  Filled 2021-05-29: qty 1.1, 7d supply, fill #0

## 2021-05-29 NOTE — Telephone Encounter (Signed)
[facility] Then we will get the correct prior auth request. Will route message to Selena Batten [facility].

## 2021-05-31 ENCOUNTER — Other Ambulatory Visit (HOSPITAL_COMMUNITY): Payer: Self-pay

## 2021-06-01 ENCOUNTER — Encounter: Payer: Self-pay | Admitting: *Deleted

## 2021-06-04 ENCOUNTER — Other Ambulatory Visit (HOSPITAL_COMMUNITY): Payer: Self-pay

## 2021-06-05 ENCOUNTER — Other Ambulatory Visit (HOSPITAL_COMMUNITY): Payer: Self-pay

## 2021-06-06 ENCOUNTER — Other Ambulatory Visit (HOSPITAL_COMMUNITY): Payer: Self-pay

## 2021-06-08 ENCOUNTER — Other Ambulatory Visit (HOSPITAL_COMMUNITY): Payer: Self-pay

## 2021-06-13 ENCOUNTER — Other Ambulatory Visit (HOSPITAL_COMMUNITY): Payer: Self-pay

## 2021-06-15 ENCOUNTER — Ambulatory Visit (INDEPENDENT_AMBULATORY_CARE_PROVIDER_SITE_OTHER): Payer: 59 | Admitting: Obstetrics & Gynecology

## 2021-06-15 ENCOUNTER — Encounter: Payer: Self-pay | Admitting: Obstetrics & Gynecology

## 2021-06-15 ENCOUNTER — Other Ambulatory Visit: Payer: Self-pay

## 2021-06-15 VITALS — BP 122/82 | HR 93 | Wt 189.0 lb

## 2021-06-15 DIAGNOSIS — Z1379 Encounter for other screening for genetic and chromosomal anomalies: Secondary | ICD-10-CM

## 2021-06-15 DIAGNOSIS — O10919 Unspecified pre-existing hypertension complicating pregnancy, unspecified trimester: Secondary | ICD-10-CM

## 2021-06-15 DIAGNOSIS — O0992 Supervision of high risk pregnancy, unspecified, second trimester: Secondary | ICD-10-CM | POA: Diagnosis not present

## 2021-06-15 LAB — POCT URINALYSIS DIPSTICK OB
Blood, UA: NEGATIVE
Glucose, UA: NEGATIVE
Ketones, UA: NEGATIVE
Leukocytes, UA: NEGATIVE
Nitrite, UA: NEGATIVE
POC,PROTEIN,UA: NEGATIVE

## 2021-06-15 MED ORDER — HYDROXYPROGESTERONE CAPROATE 275 MG/1.1ML ~~LOC~~ SOAJ
275.0000 mg | Freq: Once | SUBCUTANEOUS | Status: AC
  Administered 2021-06-15: 275 mg via SUBCUTANEOUS

## 2021-06-15 NOTE — Progress Notes (Signed)
HIGH-RISK PREGNANCY VISIT Patient name: Meghan Phelps MRN [id number]  Date of birth: 07/12/1991 Chief Complaint:   Routine Prenatal Visit and High Risk Gestation  History of Present Illness:   Meghan Phelps is a 30 y.o. [id number] female at [redacted]w[redacted]d with an Estimated Date of Delivery: 11/29/21 being seen today for ongoing management of a high-risk pregnancy complicated by chronic hypertension currently on norvasc 5 mg.    Today she reports no complaints. Contractions: Not present. Vag. Bleeding: None.  Movement: Absent. denies leaking of fluid.   Depression screen [facility] 2/9 05/22/2021 01/15/2021 01/05/2020  Decreased Interest 2 1 1   Down, Depressed, Hopeless 1 0 1  PHQ - 2 Score 3 1 2   Altered sleeping 3 1 2   Tired, decreased energy 3 1 1   Change in appetite 3 1 0  Feeling bad or failure about yourself  0 0 1  Trouble concentrating 0 0 0  Moving slowly or fidgety/restless 0 0 0  Suicidal thoughts 0 0 0  PHQ-9 Score 12 4 6   Difficult doing work/chores - - Not difficult at all     GAD 7 : Generalized Anxiety Score 05/22/2021 01/15/2021 01/05/2020  Nervous, Anxious, on Edge 2 0 1  Control/stop worrying 2 0 1  Worry too much - different things 2 1 1   Trouble relaxing 1 0 1  Restless 1 0 1  Easily annoyed or irritable 2 1 1   Afraid - awful might happen 0 0 0  Total GAD 7 Score 10 2 6   Anxiety Difficulty - - Not difficult at all     Review of Systems:   Pertinent items are noted in HPI Denies abnormal vaginal discharge w/ itching/odor/irritation, headaches, visual changes, shortness of breath, chest pain, abdominal pain, severe nausea/vomiting, or problems with urination or bowel movements unless otherwise stated above. Pertinent History Reviewed:  Reviewed past medical,surgical, social, obstetrical and family history.  Reviewed problem list, medications and allergies. Physical Assessment:   Vitals:   06/15/21 0838  BP: 122/82  Pulse: 93  Weight: 189 lb (85.7 kg)  Body mass index is  32.44 kg/m.           Physical Examination:   General appearance: alert, well appearing, and in no distress  Mental status: alert, oriented to person, place, and time  Skin: warm & dry   Extremities: Edema: None    Cardiovascular: normal heart rate noted  Respiratory: normal respiratory effort, no distress  Abdomen: gravid, soft, non-tender  Pelvic: Cervical exam deferred         Fetal Status:     Movement: Absent    Fetal Surveillance Testing today: FHR 150   Chaperone: N/A    Results for orders placed or performed in visit on 06/15/21 (from the past 24 hour(s))  POC Urinalysis Dipstick OB   Collection Time: 06/15/21  8:42 AM  Result Value Ref Range   Color, UA     Clarity, UA     Glucose, UA Negative Negative   Bilirubin, UA     Ketones, UA neg    Spec Grav, UA     Blood, UA neg    pH, UA     POC,PROTEIN,UA Negative Negative, Trace, Small (1+), Moderate (2+), Large (3+), 4+   Urobilinogen, UA     Nitrite, UA neg    Leukocytes, UA Negative Negative   Appearance     Odor      Assessment & Plan:  High-risk pregnancy: [id number] at [redacted]w[redacted]d with an  Estimated Date of Delivery: 11/29/21   1) CHTN on norvasc good control,     Meds:  Meds ordered this encounter  Medications   HYDROXYprogesterone caproate (Makena) autoinjector 275 mg    Labs/procedures today: 2nd IT  Treatment Plan:  CHTN protocol, 17P  Reviewed: NA labor symptoms and general obstetric precautions including but not limited to vaginal bleeding, contractions, leaking of fluid and fetal movement were reviewed in detail with the patient.  All questions were answered. Does have home bp cuff. Office bp cuff given: not applicable. Check bp variable, let us know if consistently >150 and/or >95.  Follow-up: Return in about 24 days (around 07/09/2021) for 20 week sono, HROB: in conjunction with her Makena appt.   Future Appointments  Date Time Provider Department Center  06/25/2021  9:50 AM [facility] NURSE  [facility]  07/02/2021  9:50 AM [facility] NURSE [facility]  07/09/2021  9:50 AM [facility] NURSE [facility]  07/16/2021  9:50 AM [facility] NURSE [facility]  07/24/2021  9:50 AM [facility] NURSE [facility]  07/31/2021 10:10 AM [facility] NURSE [facility]  08/06/2021  9:30 AM [facility] NURSE [facility]  08/13/2021  9:30 AM [facility] NURSE [facility]  08/20/2021  9:30 AM [facility] NURSE [facility]  08/27/2021  9:30 AM [facility] NURSE [facility]  09/03/2021  9:30 AM [facility] NURSE [facility]  09/10/2021  9:30 AM [facility] NURSE [facility]  09/17/2021  9:30 AM [facility] NURSE [facility]  09/24/2021  9:30 AM [facility] NURSE [facility]  10/01/2021  9:30 AM [facility] NURSE [facility]  10/08/2021  9:30 AM [facility] NURSE [facility]  10/15/2021  9:30 AM [facility] NURSE [facility]  10/22/2021  9:30 AM [facility] NURSE [facility]  10/29/2021  9:30 AM [facility] NURSE [facility]    Orders Placed This Encounter  Procedures   US OB Comp + 14 Wk   INTEGRATED 2   POC Urinalysis Dipstick OB   Nikholas Geffre H Janeliz Prestwood  06/15/2021 9:24 AM

## 2021-06-19 LAB — INTEGRATED 2
AFP MoM: 1.22
Alpha-Fetoprotein: 35.9 ng/mL
Crown Rump Length: 68 mm
DIA MoM: 0.85
DIA Value: 117.8 pg/mL
Estriol, Unconjugated: 1.15 ng/mL
Gest. Age on Collection Date: 12.9 weeks
Gestational Age: 16.3 weeks
Maternal Age at EDD: 32.7 yr
Nuchal Translucency (NT): 1.5 mm
Nuchal Translucency MoM: 0.98
Number of Fetuses: 1
PAPP-A MoM: 1.36
PAPP-A Value: 1153.9 ng/mL
Test Results:: NEGATIVE
Weight: 185 [lb_av]
Weight: 185 [lb_av]
hCG MoM: 0.61
hCG Value: 19.3 IU/mL
uE3 MoM: 1.2

## 2021-06-25 ENCOUNTER — Other Ambulatory Visit: Payer: Self-pay

## 2021-06-25 ENCOUNTER — Ambulatory Visit (INDEPENDENT_AMBULATORY_CARE_PROVIDER_SITE_OTHER): Payer: 59 | Admitting: *Deleted

## 2021-06-25 ENCOUNTER — Encounter: Payer: Self-pay | Admitting: *Deleted

## 2021-06-25 VITALS — BP 117/77 | HR 83 | Wt 189.5 lb

## 2021-06-25 DIAGNOSIS — O0992 Supervision of high risk pregnancy, unspecified, second trimester: Secondary | ICD-10-CM | POA: Diagnosis not present

## 2021-06-25 DIAGNOSIS — O09892 Supervision of other high risk pregnancies, second trimester: Secondary | ICD-10-CM | POA: Diagnosis not present

## 2021-06-25 MED ORDER — HYDROXYPROGESTERONE CAPROATE 275 MG/1.1ML ~~LOC~~ SOAJ
275.0000 mg | Freq: Once | SUBCUTANEOUS | Status: AC
  Administered 2021-06-25: 10:00:00 275 mg via SUBCUTANEOUS

## 2021-06-25 NOTE — Progress Notes (Signed)
   NURSE VISIT- INJECTION  SUBJECTIVE:  Meghan Phelps is a 30 y.o. [phone] female here for a Makena for history of preterm birth. She is [redacted]w[redacted]d pregnant.   OBJECTIVE:  BP 117/77   Pulse 83   Wt 189 lb 8 oz (86 kg)   LMP 02/22/2021 (Exact Date)   BMI 32.53 kg/m   Appears well, in no apparent distress  Injection administered in: Left arm  Meds ordered this encounter  Medications   HYDROXYprogesterone caproate (Makena) autoinjector 275 mg    ASSESSMENT: Pregnancy [redacted]w[redacted]d Makena for history of preterm birth PLAN: Follow-up: in 1 week for next Tomasa Hosteller  06/25/2021 9:59 AM

## 2021-06-26 ENCOUNTER — Other Ambulatory Visit (HOSPITAL_COMMUNITY): Payer: Self-pay

## 2021-07-02 ENCOUNTER — Other Ambulatory Visit: Payer: Self-pay

## 2021-07-02 ENCOUNTER — Ambulatory Visit (INDEPENDENT_AMBULATORY_CARE_PROVIDER_SITE_OTHER): Payer: 59

## 2021-07-02 VITALS — BP 128/86 | HR 89 | Wt 191.0 lb

## 2021-07-02 DIAGNOSIS — O0992 Supervision of high risk pregnancy, unspecified, second trimester: Secondary | ICD-10-CM | POA: Diagnosis not present

## 2021-07-02 MED ORDER — HYDROXYPROGESTERONE CAPROATE 275 MG/1.1ML ~~LOC~~ SOAJ: 275.0000 mg | SUBCUTANEOUS | Status: DC

## 2021-07-02 MED ORDER — HYDROXYPROGESTERONE CAPROATE 275 MG/1.1ML ~~LOC~~ SOAJ
275.0000 mg | Freq: Once | SUBCUTANEOUS | Status: AC
  Administered 2021-07-02: 275 mg via SUBCUTANEOUS

## 2021-07-02 NOTE — Progress Notes (Signed)
   NURSE VISIT- INJECTION  SUBJECTIVE:  Meghan Phelps is a 30 y.o. [phone] female here for a Makena for history of preterm birth. She is [redacted]w[redacted]d pregnant.   OBJECTIVE:  BP 128/86   Pulse 89   Wt 191 lb (86.6 kg)   LMP 02/22/2021 (Exact Date)   BMI 32.79 kg/m   Appears well, in no apparent distress  Injection administered in: Right arm  Meds ordered this encounter  Medications   HYDROXYprogesterone caproate (Makena) autoinjector 275 mg   HYDROXYprogesterone caproate (Makena) autoinjector 275 mg    ASSESSMENT: Pregnancy [redacted]w[redacted]d Makena for history of preterm birth PLAN: Follow-up: in 1 week for next Makena   Krysta Bloomfield A Evelean Bigler  07/02/2021 9:58 AM

## 2021-07-03 ENCOUNTER — Other Ambulatory Visit (HOSPITAL_COMMUNITY): Payer: Self-pay

## 2021-07-05 ENCOUNTER — Other Ambulatory Visit (HOSPITAL_COMMUNITY): Payer: Self-pay

## 2021-07-06 ENCOUNTER — Other Ambulatory Visit: Payer: Self-pay | Admitting: Obstetrics & Gynecology

## 2021-07-06 DIAGNOSIS — O0992 Supervision of high risk pregnancy, unspecified, second trimester: Secondary | ICD-10-CM

## 2021-07-06 DIAGNOSIS — Z363 Encounter for antenatal screening for malformations: Secondary | ICD-10-CM

## 2021-07-09 ENCOUNTER — Encounter: Payer: Self-pay | Admitting: Women's Health

## 2021-07-09 ENCOUNTER — Ambulatory Visit (INDEPENDENT_AMBULATORY_CARE_PROVIDER_SITE_OTHER): Payer: 59 | Admitting: Women's Health

## 2021-07-09 ENCOUNTER — Other Ambulatory Visit (HOSPITAL_COMMUNITY): Payer: Self-pay

## 2021-07-09 ENCOUNTER — Other Ambulatory Visit: Payer: Self-pay | Admitting: Obstetrics & Gynecology

## 2021-07-09 ENCOUNTER — Ambulatory Visit (INDEPENDENT_AMBULATORY_CARE_PROVIDER_SITE_OTHER): Payer: 59

## 2021-07-09 ENCOUNTER — Ambulatory Visit: Payer: 59

## 2021-07-09 ENCOUNTER — Other Ambulatory Visit: Payer: Self-pay

## 2021-07-09 VITALS — BP 122/84 | HR 88 | Wt 188.0 lb

## 2021-07-09 DIAGNOSIS — Z3A19 19 weeks gestation of pregnancy: Secondary | ICD-10-CM

## 2021-07-09 DIAGNOSIS — O09892 Supervision of other high risk pregnancies, second trimester: Secondary | ICD-10-CM

## 2021-07-09 DIAGNOSIS — O4442 Low lying placenta NOS or without hemorrhage, second trimester: Secondary | ICD-10-CM

## 2021-07-09 DIAGNOSIS — O0992 Supervision of high risk pregnancy, unspecified, second trimester: Secondary | ICD-10-CM

## 2021-07-09 DIAGNOSIS — O10919 Unspecified pre-existing hypertension complicating pregnancy, unspecified trimester: Secondary | ICD-10-CM

## 2021-07-09 DIAGNOSIS — O283 Abnormal ultrasonic finding on antenatal screening of mother: Secondary | ICD-10-CM

## 2021-07-09 DIAGNOSIS — Z363 Encounter for antenatal screening for malformations: Secondary | ICD-10-CM

## 2021-07-09 DIAGNOSIS — O4440 Low lying placenta NOS or without hemorrhage, unspecified trimester: Secondary | ICD-10-CM

## 2021-07-09 DIAGNOSIS — Z8751 Personal history of pre-term labor: Secondary | ICD-10-CM

## 2021-07-09 LAB — POCT URINALYSIS DIPSTICK OB
Blood, UA: NEGATIVE
Glucose, UA: NEGATIVE
Ketones, UA: NEGATIVE
Leukocytes, UA: NEGATIVE
Nitrite, UA: NEGATIVE
POC,PROTEIN,UA: NEGATIVE

## 2021-07-09 NOTE — Patient Instructions (Signed)
Meghan Phelps, thank you for choosing our office today! We appreciate the opportunity to meet your healthcare needs. You may receive a short survey by mail, e-mail, or through [employer]. If you are happy with your care we would appreciate if you could take just a few minutes to complete the survey questions. We read all of your comments and take your feedback very seriously. Thank you again for choosing our office.  Center for [employer] Team at [facility] at [facility] ([address] [place], [place] [address]) Entrance C, located off of [address] Free 24/7 valet parking  Go to [email or web address] to register for FREE online childbirth classes  Call the office [phone]) or go to [facility] if: You begin to severe cramping Your water breaks.  Sometimes it is a big gush of fluid, sometimes it is just a trickle that keeps getting your panties wet or running down your legs You have vaginal bleeding.  It is normal to have a small amount of spotting if your cervix was checked.   [facility] Pediatricians/Family Doctors [facility]): [address] Colette Ribas, [phone]           [facility]: [address], [phone]                [facility]): [address], [phone] (call to ask if accepting patients) [facility] Department: [address], [place], [phone]    [facility] Pediatricians/Family Doctors [facility]): [phone], [phone] [facility]: [address] [place], [phone] [facility]: [address], [phone]  [facility] Doctors  [facility]): [phone] [facility]: [address] [phone]   [facility] Doctors [facility]: [address] [phone]  [facility] Doctors  [facility]: [phone], [phone]  Home Blood Pressure Monitoring for Patients   Your provider has recommended that you check your blood pressure (BP) at least once a week at home. If you do not have a blood pressure cuff at home, one will be provided for you. Contact your provider if you have not received your monitor within 1 week.   Helpful Tips for Accurate Home Blood Pressure Checks  Don't smoke, exercise, or drink caffeine 30 minutes before checking your BP Use the restroom before checking your BP (a full bladder can raise your pressure) Relax in a comfortable upright chair Feet on the ground Left arm resting comfortably on a flat surface at the level of your heart Legs uncrossed Back supported Sit quietly and don't talk Place the cuff on your bare arm Adjust snuggly, so that only two fingertips can fit between your skin and the top of the cuff Check 2 readings separated by at least one minute Keep a log of your BP readings For a visual, please reference this diagram: http://ccnc.care/bpdiagram  Provider Name: [facility]     Phone: [phone]  Zone 1: ALL CLEAR  Continue to monitor your symptoms:  BP reading is less than 140 (top number) or less than 90 (bottom number)  No right upper stomach pain No headaches or seeing spots No feeling nauseated or throwing up No swelling in face and hands  Zone 2: CAUTION Call your doctor's office for any of the following:  BP reading is greater than 140 (top number) or greater than  90 (bottom number)  Stomach pain under your ribs in the middle or right side Headaches or seeing spots Feeling nauseated or throwing up Swelling in face and hands  Zone 3: EMERGENCY  Seek immediate medical care if you have any of the following:  BP reading is greater than160 (top number) or greater than 110 (bottom number) Severe headaches not improving with Tylenol Serious difficulty catching your breath Any worsening symptoms from  Zone 2     Second Trimester of Pregnancy The second trimester is from week 14 through week 27 (months 4 through 6). The second trimester is often a time when you feel your best. Your body has adjusted to being pregnant, and you begin to feel better physically. Usually, morning sickness has lessened or quit completely, you may have more energy, and you may have an increase in appetite. The second trimester is also a time when the fetus is growing rapidly. At the end of the sixth month, the fetus is about 9 inches long and weighs about 1 pounds. You will likely begin to feel the baby move (quickening) between 16 and 20 weeks of pregnancy. Body changes during your second trimester Your body continues to go through many changes during your second trimester. The changes vary from woman to woman. Your weight will continue to increase. You will notice your lower abdomen bulging out. You may begin to get stretch marks on your hips, abdomen, and breasts. You may develop headaches that can be relieved by medicines. The medicines should be approved by your health care provider. You may urinate more often because the fetus is pressing on your bladder. You may develop or continue to have heartburn as a result of your pregnancy. You may develop constipation because certain hormones are causing the muscles that push waste through your intestines to slow down. You may develop hemorrhoids or swollen, bulging veins (varicose veins). You may have back pain. This is caused by: Weight gain. Pregnancy hormones that are relaxing the joints in your pelvis. A shift in weight and the muscles that support your balance. Your breasts will continue to grow and they will continue to become tender. Your gums may bleed and may be sensitive to brushing and flossing. Dark spots or blotches (chloasma, mask of pregnancy) may develop on your face. This will likely fade after the baby is born. A dark line from your belly button to  the pubic area (linea nigra) may appear. This will likely fade after the baby is born. You may have changes in your hair. These can include thickening of your hair, rapid growth, and changes in texture. Some women also have hair loss during or after pregnancy, or hair that feels dry or thin. Your hair will most likely return to normal after your baby is born.  What to expect at prenatal visits During a routine prenatal visit: You will be weighed to make sure you and the fetus are growing normally. Your blood pressure will be taken. Your abdomen will be measured to track your baby's growth. The fetal heartbeat will be listened to. Any test results from the previous visit will be discussed.  Your health care provider may ask you: How you are feeling. If you are feeling the baby move. If you have had any abnormal symptoms, such as leaking fluid, bleeding, severe headaches, or abdominal cramping. If you are using any tobacco products, including cigarettes, chewing tobacco, and electronic cigarettes. If you have any questions.  Other tests that may be performed during   your second trimester include: Blood tests that check for: Low iron levels (anemia). High blood sugar that affects pregnant women (gestational diabetes) between 24 and 28 weeks. Rh antibodies. This is to check for a protein on red blood cells (Rh factor). Urine tests to check for infections, diabetes, or protein in the urine. An ultrasound to confirm the proper growth and development of the baby. An amniocentesis to check for possible genetic problems. Fetal screens for spina bifida and Down syndrome. HIV (human immunodeficiency virus) testing. Routine prenatal testing includes screening for HIV, unless you choose not to have this test.  Follow these instructions at home: Medicines Follow your health care provider's instructions regarding medicine use. Specific medicines may be either safe or unsafe to take during  pregnancy. Take a prenatal vitamin that contains at least 600 micrograms (mcg) of folic acid. If you develop constipation, try taking a stool softener if your health care provider approves. Eating and drinking Eat a balanced diet that includes fresh fruits and vegetables, whole grains, good sources of protein such as meat, eggs, or tofu, and low-fat dairy. Your health care provider will help you determine the amount of weight gain that is right for you. Avoid raw meat and uncooked cheese. These carry germs that can cause birth defects in the baby. If you have low calcium intake from food, talk to your health care provider about whether you should take a daily calcium supplement. Limit foods that are high in fat and processed sugars, such as fried and sweet foods. To prevent constipation: Drink enough fluid to keep your urine clear or pale yellow. Eat foods that are high in fiber, such as fresh fruits and vegetables, whole grains, and beans. Activity Exercise only as directed by your health care provider. Most women can continue their usual exercise routine during pregnancy. Try to exercise for 30 minutes at least 5 days a week. Stop exercising if you experience uterine contractions. Avoid heavy lifting, wear low heel shoes, and practice good posture. A sexual relationship may be continued unless your health care provider directs you otherwise. Relieving pain and discomfort Wear a good support bra to prevent discomfort from breast tenderness. Take warm sitz baths to soothe any pain or discomfort caused by hemorrhoids. Use hemorrhoid cream if your health care provider approves. Rest with your legs elevated if you have leg cramps or low back pain. If you develop varicose veins, wear support hose. Elevate your feet for 15 minutes, 3-4 times a day. Limit salt in your diet. Prenatal Care Write down your questions. Take them to your prenatal visits. Keep all your prenatal visits as told by your health  care provider. This is important. Safety Wear your seat belt at all times when driving. Make a list of emergency phone numbers, including numbers for family, friends, the hospital, and police and fire departments. General instructions Ask your health care provider for a referral to a local prenatal education class. Begin classes no later than the beginning of month 6 of your pregnancy. Ask for help if you have counseling or nutritional needs during pregnancy. Your health care provider can offer advice or refer you to specialists for help with various needs. Do not use hot tubs, steam rooms, or saunas. Do not douche or use tampons or scented sanitary pads. Do not cross your legs for long periods of time. Avoid cat litter boxes and soil used by cats. These carry germs that can cause birth defects in the baby and possibly loss of the   fetus by miscarriage or stillbirth. Avoid all smoking, herbs, alcohol, and unprescribed drugs. Chemicals in these products can affect the formation and growth of the baby. Do not use any products that contain nicotine or tobacco, such as cigarettes and e-cigarettes. If you need help quitting, ask your health care provider. Visit your dentist if you have not gone yet during your pregnancy. Use a soft toothbrush to brush your teeth and be gentle when you floss. Contact a health care provider if: You have dizziness. You have mild pelvic cramps, pelvic pressure, or nagging pain in the abdominal area. You have persistent nausea, vomiting, or diarrhea. You have a bad smelling vaginal discharge. You have pain when you urinate. Get help right away if: You have a fever. You are leaking fluid from your vagina. You have spotting or bleeding from your vagina. You have severe abdominal cramping or pain. You have rapid weight gain or weight loss. You have shortness of breath with chest pain. You notice sudden or extreme swelling of your face, hands, ankles, feet, or legs. You  have not felt your baby move in over an hour. You have severe headaches that do not go away when you take medicine. You have vision changes. Summary The second trimester is from week 14 through week 27 (months 4 through 6). It is also a time when the fetus is growing rapidly. Your body goes through many changes during pregnancy. The changes vary from woman to woman. Avoid all smoking, herbs, alcohol, and unprescribed drugs. These chemicals affect the formation and growth your baby. Do not use any tobacco products, such as cigarettes, chewing tobacco, and e-cigarettes. If you need help quitting, ask your health care provider. Contact your health care provider if you have any questions. Keep all prenatal visits as told by your health care provider. This is important. This information is not intended to replace advice given to you by your health care provider. Make sure you discuss any questions you have with your health care provider. Document Released: 07/09/2001 Document Revised: 12/21/2015 Document Reviewed: 09/15/2012 Elsevier Interactive Patient Education  2017 Elsevier Inc.   Placenta Previa The placenta is the organ formed during pregnancy. It carries oxygen and nutrients to the unborn baby (fetus). The placenta is the baby's life support system. Placenta previa happens when the placenta implants in the lower part of the uterus. The placenta either partially or completely covers the opening to the cervix. This can cause severe bleeding during late pregnancy or delivery. If placenta previa is diagnosed in the first half of pregnancy, the placenta may move into a normal position as the pregnancy progresses. It is important to keep all prenatal visits with your health care provider so that you can be closely monitored. What are the causes? The cause of this condition is not known. What increases the risk? The following factors may make you more likely to develop this condition: Having scars on  the lining of the uterus. Having had previous pregnancies. Having had surgeries involving the uterus, such as a cesarean delivery. Having a history of placenta previa. Having smoked or used cocaine during pregnancy. Being 54 years of age or older during pregnancy. What are the signs or symptoms? The main symptom of this condition is sudden, painless, bright red vaginal bleeding during the second half of pregnancy. The amount of bleeding can be very light at first, and it usually stops on its own. Heavier bleeding episodes may also happen. Some women with placenta previa may have no  bleeding at all. How is this diagnosed? This condition may be diagnosed during a routine ultrasound or during a checkup after vaginal bleeding is noticed. In general: If you are diagnosed with placenta previa, digital exams, which use the fingers, will be avoided. Your health care provider will still perform a speculum exam. If you did not have an ultrasound during your pregnancy, placenta previa may not be diagnosed until bleeding occurs during labor. How is this treated? Treatment for this condition depends on: How much you are bleeding, or whether the bleeding has stopped. How far along you are in your pregnancy. The condition of your baby. How much of the placenta is covering the cervix. Treatment may include: Decreased activity. Bed rest at home or in the hospital. Pelvic rest. Nothing is placed inside the vagina during pelvic rest. This means not having sex, not using tampons, and not using douches. A blood transfusion to replace blood that you have lost (maternal blood loss). A cesarean delivery. This may be performed if: The bleeding is heavy and cannot be controlled. The placenta completely covers the cervix. Medicines to stop premature labor or to help the baby's lungs to mature. This treatment may be used if you need to deliver your baby before your pregnancy is full-term. Follow these instructions at  home: Get plenty of rest and reduce activity as told by your health care provider. If told by your health care provider, stay on bed rest for the length of time that is recommended. Do not have sex, use tampons, douche, or place anything inside of your vagina if your health care provider recommends pelvic rest. Take over-the-counter and prescription medicines only as told by your health care provider. Keep all follow-up visits. This is important. Get help right away if: You have vaginal bleeding, even if in small amounts and even if you have no pain. You have cramping or regular contractions. You have pain in your abdomen or your lower back. You have a feeling of increased pressure in your pelvis. You have increased watery or bloody mucus from your vagina. You have not felt your baby moving regularly. Summary Placenta previa is a condition in which the placenta implants in the lower part of the uterus in a pregnant woman. The cause of this condition is not known. The most common symptom of placenta previa is painless, bright red bleeding during pregnancy. It is important to keep all prenatal visits with your health care provider so you can be closely monitored. Get help right away if you have placenta previa and you are experiencing bleeding during pregnancy. This information is not intended to replace advice given to you by your health care provider. Make sure you discuss any questions you have with your health care provider. Document Revised: 03/06/2020 Document Reviewed: 03/06/2020 Elsevier Patient Education  2022 [employer].

## 2021-07-09 NOTE — Progress Notes (Signed)
HIGH-RISK PREGNANCY VISIT Patient name: Meghan Phelps MRN [id number]  Date of birth: Dec 31, 1990 Chief Complaint:   Routine Prenatal Visit (Anatomy Scan)  History of Present Illness:   Meghan Phelps is a 30 y.o. [phone] female at [redacted]w[redacted]d with an Estimated Date of Delivery: 11/29/21 being seen today for ongoing management of a high-risk pregnancy complicated by chronic hypertension currently on norvasc 5mg .    Today she reports  doesn't want  . Contractions: Not present. Vag. Bleeding: None.  Movement: Present. denies leaking of fluid.   Depression screen [facility] 2/9 05/22/2021 01/15/2021 01/05/2020  Decreased Interest 2 1 1   Down, Depressed, Hopeless 1 0 1  PHQ - 2 Score 3 1 2   Altered sleeping 3 1 2   Tired, decreased energy 3 1 1   Change in appetite 3 1 0  Feeling bad or failure about yourself  0 0 1  Trouble concentrating 0 0 0  Moving slowly or fidgety/restless 0 0 0  Suicidal thoughts 0 0 0  PHQ-9 Score 12 4 6   Difficult doing work/chores - - Not difficult at all     GAD 7 : Generalized Anxiety Score 05/22/2021 01/15/2021 01/05/2020  Nervous, Anxious, on Edge 2 0 1  Control/stop worrying 2 0 1  Worry too much - different things 2 1 1   Trouble relaxing 1 0 1  Restless 1 0 1  Easily annoyed or irritable 2 1 1   Afraid - awful might happen 0 0 0  Total GAD 7 Score 10 2 6   Anxiety Difficulty - - Not difficult at all     Review of Systems:   Pertinent items are noted in HPI Denies abnormal vaginal discharge w/ itching/odor/irritation, headaches, visual changes, shortness of breath, chest pain, abdominal pain, severe nausea/vomiting, or problems with urination or bowel movements unless otherwise stated above. Pertinent History Reviewed:  Reviewed past medical,surgical, social, obstetrical and family history.  Reviewed problem list, medications and allergies. Physical Assessment:   Vitals:   07/09/21 1610  BP: 122/84  Pulse: 88  Weight: 188 lb (85.3 kg)  Body mass index is 32.27  kg/m.           Physical Examination:   General appearance: alert, well appearing, and in no distress  Mental status: alert, oriented to person, place, and time  Skin: warm & dry   Extremities: Edema: None    Cardiovascular: normal heart rate noted  Respiratory: normal respiratory effort, no distress  Abdomen: gravid, soft, non-tender  Pelvic: Cervical exam deferred         Fetal Status: Fetal Heart Rate (bpm): 144 u/s   Movement: Present    Fetal Surveillance Testing today: with TV: 19+4 wks,cephalic,left lateral low lying placenta gr 0,tip of placenta to cx .6 cm,SVP of fluid 5 cm,two LVECIF 1.5 & 2 mm,fhr 144 bpm,EFW 353 g 88%,anatomy complete  Chaperone: N/A    Results for orders placed or performed in visit on 07/09/21 (from the past 24 hour(s))  POC Urinalysis Dipstick OB   Collection Time: 07/09/21  4:13 PM  Result Value Ref Range   Color, UA     Clarity, UA     Glucose, UA Negative Negative   Bilirubin, UA     Ketones, UA neg    Spec Grav, UA     Blood, UA neg    pH, UA     POC,PROTEIN,UA Negative Negative, Trace, Small (1+), Moderate (2+), Large (3+), 4+   Urobilinogen, UA     Nitrite,  UA neg    Leukocytes, UA Negative Negative   Appearance     Odor      Assessment & Plan:  High-risk pregnancy: [id number] at [redacted]w[redacted]d with an Estimated Date of Delivery: 11/29/21   1) CHTN, stable on norvasc 5mg , ASA  2) H/O 36.5wk PTB, does not want to continue Makena  3) Low-lying placenta> 0.6cm from os, no vb, pelvic rest for now, will repeat in 4wks  4) Isolated fetal LVEICF x 2> neg IT, discussed and gave printed info  Meds: No orders of the defined types were placed in this encounter.   Labs/procedures today: U/S  Treatment Plan:  Growth u/s q 4wks    2x/wk testing nst/sono @ 32wks     Deliver 38-39wks (37wks or prn if poor control)____   Reviewed: Preterm labor symptoms and general obstetric precautions including but not limited to vaginal bleeding, contractions,  leaking of fluid and fetal movement were reviewed in detail with the patient.  All questions were answered. Does have home bp cuff. Office bp cuff given: not applicable. Check bp weekly, let us know if consistently >150 and/or >95.  Follow-up: Return in about 4 weeks (around 08/06/2021) for [place], US:EFW, MD or CNM, in person (cancel weekly nurse/injection visits).   Future Appointments  Date Time Provider P[place]  07/16/2021  9:50 AM [facility] NURSE [facility]  07/24/2021  9:50 AM [facility] NURSE [facility]  07/31/2021 10:10 AM [facility] NURSE [facility]  08/06/2021  9:30 AM [facility] NURSE [facility]  08/06/2021  2:00 PM [place] - [facility] [place] [facility] None  08/06/2021  2:50 PM Florian Buff, MD [facility]  08/13/2021  9:30 AM [facility] NURSE [facility]  08/20/2021  9:30 AM [facility] NURSE [facility]  08/27/2021  9:30 AM [facility] NURSE [facility]  09/03/2021  9:30 AM [facility] NURSE [facility]  09/10/2021  9:30 AM [facility] NURSE [facility]  09/17/2021  9:30 AM [facility] NURSE [facility]  09/24/2021  9:30 AM [facility] NURSE [facility]  10/01/2021  9:30 AM [facility] NURSE [facility]  10/08/2021  9:30 AM [facility] NURSE [facility]  10/15/2021  9:30 AM [facility] NURSE [facility]  10/22/2021  9:30 AM [facility] NURSE [facility]  10/29/2021  9:30 AM [facility] NURSE [facility]    Orders Placed This Encounter  Procedures   POC Urinalysis Dipstick OB   Roma Schanz CNM, WHNP-BC 07/09/2021 4:45 PM

## 2021-07-09 NOTE — Progress Notes (Signed)
[place] with TV: 19+4 wks,cephalic,left lateral low lying placenta gr 0,tip of placenta to cx .6 cm,SVP of fluid 5 cm,two LVECIF 1.5 & 2 mm,fhr 144 bpm,EFW 353 g 88%,anatomy complete

## 2021-07-16 ENCOUNTER — Ambulatory Visit: Payer: 59

## 2021-07-18 ENCOUNTER — Other Ambulatory Visit (HOSPITAL_COMMUNITY): Payer: Self-pay

## 2021-07-24 ENCOUNTER — Ambulatory Visit: Payer: 59

## 2021-07-26 ENCOUNTER — Encounter: Payer: Self-pay | Admitting: Obstetrics & Gynecology

## 2021-07-29 NOTE — L&D Delivery Note (Signed)
Delivery Note ?Meghan Phelps is a 31 y.o. [id number] at [redacted]w[redacted]d admitted for IOL d/t CHTN (Norvasc), A1GDM (Well controlled).  ? ?GBS Status: Negative/-- (04/11 1231) ?Maximum Maternal Temperature: 98.2 F ? ?Labor course: Initial SVE: 4/50/-2. Augmentation with: Pitocin. She then progressed to complete.  ?ROM: 3h 12m with clear fluid ? ?Birth: At [phone] a viable female was delivered via spontaneous vaginal delivery (Presentation: ROA  ). Nuchal cord present: Yes, nuchal x2, delivered through then reduced.  Shoulders and body delivered in usual fashion. Infant placed directly on mom's abdomen for bonding/skin-to-skin, baby dried and stimulated. Cord clamped x 2 after 1 minute and cut by FOB.  Cord blood collected.  The placenta separated spontaneously and delivered schultz via gentle cord traction.  Pitocin infused rapidly IV per protocol.  Fundus firm with massage.  ?Placenta inspected and appears to be intact with a 3 VC.  Placenta/Cord with the following complications: N/A .  Cord pH: N/A ?Sponge and instrument count were correct x2. ? ?Intrapartum complications:  Gestational Diabetes, diet controlled and CHTN ?Anesthesia:  epidural ?Episiotomy: none ?Lacerations:  none ?Suture Repair:  N/A ?EBL (mL): 200 ? ? ?Infant: ?APGAR (1 MIN): 9   ?APGAR (5 MINS): 9   ?APGAR (10 MINS):    ?Infant weight: pending ? ?Mom to postpartum.  Baby to Couplet care / Skin to Skin. Placenta to L&D   ?Plans to Breastfeed ?Contraception: IUD (Mirena outpatient) ?Circumcision: wants inpatient ? ?Note sent to [facility]: FT for pp visit. ? ?Karl Pock, SNM ?11/15/2021 ?8:50 AM ? ?  ?

## 2021-07-31 ENCOUNTER — Ambulatory Visit: Payer: 59

## 2021-08-03 ENCOUNTER — Other Ambulatory Visit: Payer: Self-pay | Admitting: Adult Health

## 2021-08-06 ENCOUNTER — Ambulatory Visit (INDEPENDENT_AMBULATORY_CARE_PROVIDER_SITE_OTHER): Payer: 59 | Admitting: Obstetrics & Gynecology

## 2021-08-06 ENCOUNTER — Other Ambulatory Visit: Payer: Self-pay

## 2021-08-06 ENCOUNTER — Ambulatory Visit (INDEPENDENT_AMBULATORY_CARE_PROVIDER_SITE_OTHER): Payer: 59

## 2021-08-06 ENCOUNTER — Encounter: Payer: Self-pay | Admitting: Obstetrics & Gynecology

## 2021-08-06 ENCOUNTER — Ambulatory Visit: Payer: 59

## 2021-08-06 VITALS — BP 124/80 | HR 84 | Wt 194.0 lb

## 2021-08-06 DIAGNOSIS — Z3A23 23 weeks gestation of pregnancy: Secondary | ICD-10-CM | POA: Diagnosis not present

## 2021-08-06 DIAGNOSIS — O4440 Low lying placenta NOS or without hemorrhage, unspecified trimester: Secondary | ICD-10-CM

## 2021-08-06 DIAGNOSIS — O0992 Supervision of high risk pregnancy, unspecified, second trimester: Secondary | ICD-10-CM | POA: Diagnosis not present

## 2021-08-06 DIAGNOSIS — O283 Abnormal ultrasonic finding on antenatal screening of mother: Secondary | ICD-10-CM

## 2021-08-06 DIAGNOSIS — O10919 Unspecified pre-existing hypertension complicating pregnancy, unspecified trimester: Secondary | ICD-10-CM

## 2021-08-06 MED ORDER — AMLODIPINE BESYLATE 5 MG PO TABS: 5.0000 mg | ORAL_TABLET | Freq: Every day | ORAL | 11 refills | Status: DC

## 2021-08-06 NOTE — Progress Notes (Signed)
[place] 23+4 wks,breech,left lateral placenta gr 0,low lying placenta,tip of placenta to cx 1.7 cm,normal ovaries,cx 4.5 cm,svp of fluid 4.4 cm,two LVEICF N/C,FHR 150 bpm,EFW 709 g 84%

## 2021-08-06 NOTE — Progress Notes (Signed)
HIGH-RISK PREGNANCY VISIT Patient name: Meghan Phelps MRN [id number]  Date of birth: 1991-06-16 Chief Complaint:   Routine Prenatal Visit  History of Present Illness:   Meghan Phelps is a 31 y.o. [id number] female at [redacted]w[redacted]d with an Estimated Date of Delivery: 11/29/21 being seen today for ongoing management of a high-risk pregnancy complicated by chronic hypertension currently on Norvasc 5 mg.    Today she reports no complaints. Contractions: Not present. Vag. Bleeding: None.  Movement: Present. denies leaking of fluid.   Depression screen [facility] 2/9 05/22/2021 01/15/2021 01/05/2020  Decreased Interest 2 1 1   Down, Depressed, Hopeless 1 0 1  PHQ - 2 Score 3 1 2   Altered sleeping 3 1 2   Tired, decreased energy 3 1 1   Change in appetite 3 1 0  Feeling bad or failure about yourself  0 0 1  Trouble concentrating 0 0 0  Moving slowly or fidgety/restless 0 0 0  Suicidal thoughts 0 0 0  PHQ-9 Score 12 4 6   Difficult doing work/chores - - Not difficult at all     GAD 7 : Generalized Anxiety Score 05/22/2021 01/15/2021 01/05/2020  Nervous, Anxious, on Edge 2 0 1  Control/stop worrying 2 0 1  Worry too much - different things 2 1 1   Trouble relaxing 1 0 1  Restless 1 0 1  Easily annoyed or irritable 2 1 1   Afraid - awful might happen 0 0 0  Total GAD 7 Score 10 2 6   Anxiety Difficulty - - Not difficult at all     Review of Systems:   Pertinent items are noted in HPI Denies abnormal vaginal discharge w/ itching/odor/irritation, headaches, visual changes, shortness of breath, chest pain, abdominal pain, severe nausea/vomiting, or problems with urination or bowel movements unless otherwise stated above. Pertinent History Reviewed:  Reviewed past medical,surgical, social, obstetrical and family history.  Reviewed problem list, medications and allergies. Physical Assessment:   Vitals:   08/06/21 1447  BP: 124/80  Pulse: 84  Weight: 194 lb (88 kg)  Body mass index is 33.3 kg/m.            Physical Examination:   General appearance: alert, well appearing, and in no distress  Mental status: alert, oriented to person, place, and time  Skin: warm & dry   Extremities: Edema: None    Cardiovascular: normal heart rate noted  Respiratory: normal respiratory effort, no distress  Abdomen: gravid, soft, non-tender  Pelvic: Cervical exam deferred         Fetal Status: Fetal Heart Rate (bpm): 150 Fundal Height: 24 cm Movement: Present    Fetal Surveillance Testing today: sonogram is normal   Chaperone: N/A    No results found for this or any previous visit (from the past 24 hour(s)).  Assessment & Plan:  High-risk pregnancy: [id number] at [redacted]w[redacted]d with an Estimated Date of Delivery: 11/29/21   1) CHTN, stable, on Norvasc 5 mg  2) LLP, resolving,   Meds:  Meds ordered this encounter  Medications   amLODipine (NORVASC) 5 MG tablet    Sig: Take 1 tablet (5 mg total) by mouth daily.    Dispense:  30 tablet    Refill:  11    Labs/procedures today: U/S  Treatment Plan:  per protocol    Follow-up: Return in about 4 weeks (around 09/03/2021) for sonogram, PN2.   No future appointments.  No orders of the defined types were placed in this encounter.  Florian Buff  08/06/2021 2:57 PM

## 2021-08-13 ENCOUNTER — Ambulatory Visit: Payer: 59

## 2021-08-20 ENCOUNTER — Ambulatory Visit: Payer: 59

## 2021-08-27 ENCOUNTER — Other Ambulatory Visit: Payer: Self-pay | Admitting: Obstetrics & Gynecology

## 2021-08-27 ENCOUNTER — Ambulatory Visit: Payer: 59

## 2021-08-27 DIAGNOSIS — O10919 Unspecified pre-existing hypertension complicating pregnancy, unspecified trimester: Secondary | ICD-10-CM

## 2021-08-28 ENCOUNTER — Other Ambulatory Visit: Payer: Self-pay

## 2021-08-28 ENCOUNTER — Other Ambulatory Visit: Payer: Self-pay | Admitting: Obstetrics & Gynecology

## 2021-08-28 ENCOUNTER — Encounter: Payer: Self-pay | Admitting: Obstetrics & Gynecology

## 2021-08-28 ENCOUNTER — Ambulatory Visit (INDEPENDENT_AMBULATORY_CARE_PROVIDER_SITE_OTHER): Payer: 59

## 2021-08-28 ENCOUNTER — Other Ambulatory Visit: Payer: 59

## 2021-08-28 ENCOUNTER — Ambulatory Visit: Payer: 59 | Admitting: Obstetrics & Gynecology

## 2021-08-28 VITALS — BP 129/78 | HR 90 | Wt 196.0 lb

## 2021-08-28 DIAGNOSIS — O4442 Low lying placenta NOS or without hemorrhage, second trimester: Secondary | ICD-10-CM

## 2021-08-28 DIAGNOSIS — Z131 Encounter for screening for diabetes mellitus: Secondary | ICD-10-CM | POA: Diagnosis not present

## 2021-08-28 DIAGNOSIS — O10919 Unspecified pre-existing hypertension complicating pregnancy, unspecified trimester: Secondary | ICD-10-CM | POA: Diagnosis not present

## 2021-08-28 DIAGNOSIS — Z3A26 26 weeks gestation of pregnancy: Secondary | ICD-10-CM

## 2021-08-28 DIAGNOSIS — O0992 Supervision of high risk pregnancy, unspecified, second trimester: Secondary | ICD-10-CM

## 2021-08-28 DIAGNOSIS — O24415 Gestational diabetes mellitus in pregnancy, controlled by oral hypoglycemic drugs: Secondary | ICD-10-CM

## 2021-08-28 NOTE — Progress Notes (Signed)
[place] 26+5 wks,cephalic,cx 3.8 cm,left lateral placenta gr 0,tip of placenta to cx 2.3 cm,LVEICF 2.1 mm,FHR 140 bpm,AFI 16 cm,EFW 1131 81%

## 2021-08-28 NOTE — Progress Notes (Unsigned)
HIGH-RISK PREGNANCY VISIT Patient name: Meghan Phelps MRN [id number]  Date of birth: Jan 28, 1991 Chief Complaint:   Routine Prenatal Visit (PN2 and ultrasound)  History of Present Illness:   Meghan Phelps is a 31 y.o. [id number] female at [redacted]w[redacted]d with an Estimated Date of Delivery: 11/29/21 being seen today for ongoing management of a high-risk pregnancy complicated by {High Risk OB:23190}.    Today she reports {pregnancy symptoms:25616::"no complaints"}. Contractions: Not present. Vag. Bleeding: None.  Movement: Present. {Actions; denies-reports:120008} leaking of fluid.   Depression screen [facility] 2/9 08/28/2021 05/22/2021 01/15/2021 01/05/2020  Decreased Interest 0 2 1 1   Down, Depressed, Hopeless 0 1 0 1  PHQ - 2 Score 0 3 1 2   Altered sleeping 1 3 1 2   Tired, decreased energy 1 3 1 1   Change in appetite 0 3 1 0  Feeling bad or failure about yourself  0 0 0 1  Trouble concentrating 0 0 0 0  Moving slowly or fidgety/restless 0 0 0 0  Suicidal thoughts 0 0 0 0  PHQ-9 Score 2 12 4 6   Difficult doing work/chores - - - Not difficult at all     GAD 7 : Generalized Anxiety Score 08/28/2021 05/22/2021 01/15/2021 01/05/2020  Nervous, Anxious, on Edge 1 2 0 1  Control/stop worrying 0 2 0 1  Worry too much - different things 0 2 1 1   Trouble relaxing 1 1 0 1  Restless 0 1 0 1  Easily annoyed or irritable 1 2 1 1   Afraid - awful might happen 0 0 0 0  Total GAD 7 Score 3 10 2 6   Anxiety Difficulty - - - Not difficult at all     Review of Systems:   Pertinent items are noted in HPI Denies abnormal vaginal discharge w/ itching/odor/irritation, headaches, visual changes, shortness of breath, chest pain, abdominal pain, severe nausea/vomiting, or problems with urination or bowel movements unless otherwise stated above. Pertinent History Reviewed:  Reviewed past medical,surgical, social, obstetrical and family history.  Reviewed problem list, medications and allergies. Physical Assessment:   Vitals:    08/28/21 0903  BP: 129/78  Pulse: 90  Weight: 196 lb (88.9 kg)  Body mass index is 33.64 kg/m.           Physical Examination:   General appearance: {:315021}  Mental status: {:313008}  Skin: warm & dry   Extremities: Edema: None    Cardiovascular: normal heart rate noted  Respiratory: normal respiratory effort, no distress  Abdomen: gravid, soft, non-tender  Pelvic: {Blank single:19197::"Cervical exam performed","Cervical exam deferred"}         Fetal Status:     Movement: Present    Fetal Surveillance Testing today: ***   Chaperone: {Chaperone:19197::"N/A","pt declined","Latisha Cresenzo","Janet Young","Amanda Andrews","Peggy Dones","Angel Neas"}    No results found for this or any previous visit (from the past 24 hour(s)).  Assessment & Plan:  High-risk pregnancy: [id number] at [redacted]w[redacted]d with an Estimated Date of Delivery: 11/29/21   1) ***, {stable/unstable:60080}  2) ***, {stable/unstable:60080}  Meds: No orders of the defined types were placed in this encounter.   Labs/procedures today: {ob lab/procedures:25214}  Treatment Plan:  ***  Reviewed: {Blank single:19197::"Term","Preterm"} labor symptoms and general obstetric precautions including but not limited to vaginal bleeding, contractions, leaking of fluid and fetal movement were reviewed in detail with the patient.  All questions were answered. {does does not:25387::"Does"} have home bp cuff. Office bp cuff given: {yes/no/default [id number] applicable"}. Check bp {weekly daily:25388::"weekly"}, let us know  if consistently {pregnant bp:25389::">140 and/or >90"}.  Follow-up: No follow-ups on file.   No future appointments.  No orders of the defined types were placed in this encounter.  Florian Buff  Attending Physician for the [facility] 08/28/2021 12:12 PM

## 2021-08-29 ENCOUNTER — Encounter: Payer: Self-pay | Admitting: Women's Health

## 2021-08-29 DIAGNOSIS — Z8632 Personal history of gestational diabetes: Secondary | ICD-10-CM | POA: Insufficient documentation

## 2021-08-29 DIAGNOSIS — O24419 Gestational diabetes mellitus in pregnancy, unspecified control: Secondary | ICD-10-CM | POA: Insufficient documentation

## 2021-08-29 LAB — CBC
Hematocrit: 39.8 % (ref 34.0–46.6)
Hemoglobin: 13.3 g/dL (ref 11.1–15.9)
MCH: 30.1 pg (ref 26.6–33.0)
MCHC: 33.4 g/dL (ref 31.5–35.7)
MCV: 90 fL (ref 79–97)
Platelets: 344 10*3/uL (ref 150–450)
RBC: 4.42 x10E6/uL (ref 3.77–5.28)
RDW: 11.8 % (ref 11.7–15.4)
WBC: 14.1 10*3/uL — ABNORMAL HIGH (ref 3.4–10.8)

## 2021-08-29 LAB — HIV ANTIBODY (ROUTINE TESTING W REFLEX): HIV Screen 4th Generation wRfx: NONREACTIVE

## 2021-08-29 LAB — GLUCOSE TOLERANCE, 2 HOURS W/ 1HR
Glucose, 1 hour: 184 mg/dL — ABNORMAL HIGH (ref 70–179)
Glucose, 2 hour: 141 mg/dL (ref 70–152)
Glucose, Fasting: 72 mg/dL (ref 70–91)

## 2021-08-29 LAB — ANTIBODY SCREEN: Antibody Screen: NEGATIVE

## 2021-08-29 LAB — RPR: RPR Ser Ql: NONREACTIVE

## 2021-08-29 MED ORDER — ONETOUCH VERIO W/DEVICE KIT: PACK | 0 refills | Status: DC

## 2021-08-29 MED ORDER — GLUCOSE BLOOD VI STRP: ORAL_STRIP | 99 refills | Status: DC

## 2021-08-29 MED ORDER — ONETOUCH ULTRASOFT LANCETS MISC: 99 refills | Status: DC

## 2021-08-29 NOTE — Progress Notes (Signed)
Diabetic supplies and referral sent in

## 2021-08-29 NOTE — Addendum Note (Signed)
Addended by: Dorita Sciara, Alfreddie Consalvo A on: 08/29/2021 04:02 PM   Modules accepted: Orders

## 2021-09-03 ENCOUNTER — Ambulatory Visit: Payer: 59

## 2021-09-10 ENCOUNTER — Ambulatory Visit: Payer: 59

## 2021-09-12 ENCOUNTER — Other Ambulatory Visit: Payer: Self-pay

## 2021-09-12 ENCOUNTER — Encounter: Payer: 59 | Attending: Women's Health | Admitting: Registered"

## 2021-09-12 DIAGNOSIS — O24419 Gestational diabetes mellitus in pregnancy, unspecified control: Secondary | ICD-10-CM | POA: Insufficient documentation

## 2021-09-13 ENCOUNTER — Encounter: Payer: Self-pay | Admitting: Registered"

## 2021-09-13 NOTE — Progress Notes (Signed)
Patient was seen on 09/12/21 for Gestational Diabetes self-management class at [facility]. The following learning objectives were met by the patient during this course:  States the definition of Gestational Diabetes States why dietary management is important in controlling blood glucose Describes the effects each nutrient has on blood glucose levels Demonstrates ability to create a balanced meal plan Demonstrates carbohydrate counting  States when to check blood glucose levels Demonstrates proper blood glucose monitoring techniques States the effect of stress and exercise on blood glucose levels States the importance of limiting caffeine and abstaining from alcohol and smoking  Blood glucose monitor given: none, has meter prior to class  Patient instructed to monitor glucose levels: FBS: 60 - <95; 1 hour: <140; 2 hour: <120  Patient received handouts: Nutrition Diabetes and Pregnancy, including carb counting list  Patient will be seen for follow-up as needed.

## 2021-09-17 ENCOUNTER — Ambulatory Visit: Payer: 59

## 2021-09-24 ENCOUNTER — Ambulatory Visit: Payer: 59

## 2021-09-25 ENCOUNTER — Ambulatory Visit (INDEPENDENT_AMBULATORY_CARE_PROVIDER_SITE_OTHER): Payer: 59 | Admitting: Women's Health

## 2021-09-25 ENCOUNTER — Encounter: Payer: Self-pay | Admitting: Women's Health

## 2021-09-25 ENCOUNTER — Other Ambulatory Visit: Payer: Self-pay

## 2021-09-25 VITALS — BP 113/75 | HR 93 | Wt 193.0 lb

## 2021-09-25 DIAGNOSIS — I10 Essential (primary) hypertension: Secondary | ICD-10-CM

## 2021-09-25 DIAGNOSIS — O0993 Supervision of high risk pregnancy, unspecified, third trimester: Secondary | ICD-10-CM

## 2021-09-25 DIAGNOSIS — Z23 Encounter for immunization: Secondary | ICD-10-CM

## 2021-09-25 DIAGNOSIS — Z3A30 30 weeks gestation of pregnancy: Secondary | ICD-10-CM

## 2021-09-25 DIAGNOSIS — O24410 Gestational diabetes mellitus in pregnancy, diet controlled: Secondary | ICD-10-CM

## 2021-09-25 LAB — POCT URINALYSIS DIPSTICK OB
Glucose, UA: NEGATIVE
Ketones, UA: NEGATIVE
Nitrite, UA: NEGATIVE
POC,PROTEIN,UA: NEGATIVE

## 2021-09-25 NOTE — Patient Instructions (Signed)
Meghan Phelps, thank you for choosing our office today! We appreciate the opportunity to meet your healthcare needs. You may receive a short survey by mail, e-mail, or through [employer]. If you are happy with your care we would appreciate if you could take just a few minutes to complete the survey questions. We read all of your comments and take your feedback very seriously. Thank you again for choosing our office.  Center for [employer] Team at [facility]  [facility] at [facility] ([address] [place], [place] [address]) Entrance C, located off of [address] Free 24/7 valet parking   CLASSES: Go to [email or web address] to register for classes (childbirth, breastfeeding, waterbirth, infant CPR, daddy bootcamp, etc.)  Call the office [phone]) or go to [facility] if: You begin to have strong, frequent contractions Your water breaks.  Sometimes it is a big gush of fluid, sometimes it is just a trickle that keeps getting your panties wet or running down your legs You have vaginal bleeding.  It is normal to have a small amount of spotting if your cervix was checked.  You don't feel your baby moving like normal.  If you don't, get you something to eat and drink and lay down and focus on feeling your baby move.   If your baby is still not moving like normal, you should call the office or go to [facility].  Call the office [phone]) or go to [facility] hospital for these signs of pre-eclampsia: Severe headache that does not go away with Tylenol Visual changes- seeing spots, double, blurred vision Pain under your right breast or upper abdomen that does not go away with Tums or heartburn medicine Nausea and/or vomiting Severe swelling in your hands, feet, and face   Tdap Vaccine It is recommended that you get the Tdap vaccine during the third trimester of EACH pregnancy to help protect your baby from getting pertussis (whooping cough) 27-36 weeks is the BEST time to do  this so that you can pass the protection on to your baby. During pregnancy is better than after pregnancy, but if you are unable to get it during pregnancy it will be offered at the hospital.  You can get this vaccine with [place], at the health department, your family doctor, or some local pharmacies Everyone who will be around your baby should also be up-to-date on their vaccines before the baby comes. Adults (who are not pregnant) only need 1 dose of Tdap during adulthood.   [facility] Pediatricians/Family Doctors [facility]): [address] Colette Ribas, [phone]           [facility]: [address], [phone]                [facility] Medicine [facility]): [address], [phone] (call to ask if accepting patients) [facility] Department: [address], [place], [phone]    [facility] Pediatricians/Family Doctors [facility]): [phone] [address], [phone] [facility]: [address] [place], [phone] [facility]: [address], [phone]  [facility] Doctors  [facility]): [phone] [facility]: [address], [phone]   [facility] Doctors [facility]: [address] [phone]  [facility] Family Doctors  [facility]: [phone], [phone]  Home Blood Pressure Monitoring for Patients   Your provider has recommended that you check your  blood pressure (BP) at least once a week at home. If you do not have a blood pressure cuff at home, one will be provided for you. Contact your provider if you have not received your monitor within 1 week.  ° °Helpful Tips for Accurate Home Blood Pressure Checks  °Don't smoke, exercise, or drink caffeine 30 minutes before checking your BP °Use the restroom before checking your BP (a full bladder can raise your  pressure) °Relax in a comfortable upright chair °Feet on the ground °Left arm resting comfortably on a flat surface at the level of your heart °Legs uncrossed °Back supported °Sit quietly and don't talk °Place the cuff on your bare arm °Adjust snuggly, so that only two fingertips can fit between your skin and the top of the cuff °Check 2 readings separated by at least one minute °Keep a log of your BP readings °For a visual, please reference this diagram: http://ccnc.care/bpdiagram ° °Provider Name: [facility]     Phone: [phone] ° °Zone 1: ALL CLEAR  °Continue to monitor your symptoms:  °BP reading is less than 140 (top number) or less than 90 (bottom number)  °No right upper stomach pain °No headaches or seeing spots °No feeling nauseated or throwing up °No swelling in face and hands ° °Zone 2: CAUTION °Call your doctor's office for any of the following:  °BP reading is greater than 140 (top number) or greater than 90 (bottom number)  °Stomach pain under your ribs in the middle or right side °Headaches or seeing spots °Feeling nauseated or throwing up °Swelling in face and hands ° °Zone 3: EMERGENCY  °Seek immediate medical care if you have any of the following:  °BP reading is greater than160 (top number) or greater than 110 (bottom number) °Severe headaches not improving with Tylenol °Serious difficulty catching your breath °Any worsening symptoms from Zone 2  °Preterm Labor and Birth Information ° °The normal length of a pregnancy is 39-41 weeks. Preterm labor is when labor starts before 37 completed weeks of pregnancy. °What are the risk factors for preterm labor? °Preterm labor is more likely to occur in women who: °Have certain infections during pregnancy such as a bladder infection, sexually transmitted infection, or infection inside the uterus (chorioamnionitis). °Have a shorter-than-normal cervix. °Have gone into preterm labor before. °Have had surgery on their cervix. °Are younger than age 17  or older than age 35. °Are African American. °Are pregnant with twins or multiple babies (multiple gestation). °Take street drugs or smoke while pregnant. °Do not gain enough weight while pregnant. °Became pregnant shortly after having been pregnant. °What are the symptoms of preterm labor? °Symptoms of preterm labor include: °Cramps similar to those that can happen during a menstrual period. The cramps may happen with diarrhea. °Pain in the abdomen or lower back. °Regular uterine contractions that may feel like tightening of the abdomen. °A feeling of increased pressure in the pelvis. °Increased watery or bloody mucus discharge from the vagina. °Water breaking (ruptured amniotic sac). °Why is it important to recognize signs of preterm labor? °It is important to recognize signs of preterm labor because babies who are born prematurely may not be fully developed. This can put them at an increased risk for: °Long-term (chronic) heart and lung problems. °Difficulty immediately after birth with regulating body systems, including blood sugar, body temperature, heart rate, and breathing rate. °Bleeding in the brain. °Cerebral palsy. °Learning difficulties. °Death. °These risks are highest for babies who are born before 34 weeks   of pregnancy. How is preterm labor treated? Treatment depends on the length of your pregnancy, your condition, and the health of your baby. It may involve: Having a stitch (suture) placed in your cervix to prevent your cervix from opening too early (cerclage). Taking or being given medicines, such as: Hormone medicines. These may be given early in pregnancy to help support the pregnancy. Medicine to stop contractions. Medicines to help mature the babys lungs. These may be prescribed if the risk of delivery is high. Medicines to prevent your baby from developing cerebral palsy. If the labor happens before 34 weeks of pregnancy, you may need to stay in the hospital. What should I do if I  think I am in preterm labor? If you think that you are going into preterm labor, call your health care provider right away. How can I prevent preterm labor in future pregnancies? To increase your chance of having a full-term pregnancy: Do not use any tobacco products, such as cigarettes, chewing tobacco, and e-cigarettes. If you need help quitting, ask your health care provider. Do not use street drugs or medicines that have not been prescribed to you during your pregnancy. Talk with your health care provider before taking any herbal supplements, even if you have been taking them regularly. Make sure you gain a healthy amount of weight during your pregnancy. Watch for infection. If you think that you might have an infection, get it checked right away. Make sure to tell your health care provider if you have gone into preterm labor before. This information is not intended to replace advice given to you by your health care provider. Make sure you discuss any questions you have with your health care provider. Document Revised: 11/06/2018 Document Reviewed: 12/06/2015 Elsevier Patient Education  [place].

## 2021-09-25 NOTE — Progress Notes (Signed)
HIGH-RISK PREGNANCY VISIT Patient name: Meghan Phelps MRN [id number]  Date of birth: 05/25/1991 Chief Complaint:   High Risk Gestation  History of Present Illness:   Meghan Phelps is a 31 y.o. [phone] female at [redacted]w[redacted]d with an Estimated Date of Delivery: 11/29/21 being seen today for ongoing management of a high-risk pregnancy complicated by chronic hypertension currently on norvasc 5mg  and diabetes mellitus A1DM.    Today she reports  fbs 72-112 (only 2 >95), 2hr pp 76-141 (only 5 >120) . Contractions: Not present. Vag. Bleeding: None.  Movement: Present. denies leaking of fluid.   Depression screen [facility] 2/9 09/13/2021 08/28/2021 05/22/2021 01/15/2021 01/05/2020  Decreased Interest 0 0 2 1 1   Down, Depressed, Hopeless 0 0 1 0 1  PHQ - 2 Score 0 0 3 1 2   Altered sleeping - 1 3 1 2   Tired, decreased energy - 1 3 1 1   Change in appetite - 0 3 1 0  Feeling bad or failure about yourself  - 0 0 0 1  Trouble concentrating - 0 0 0 0  Moving slowly or fidgety/restless - 0 0 0 0  Suicidal thoughts - 0 0 0 0  PHQ-9 Score - 2 12 4 6   Difficult doing work/chores - - - - Not difficult at all     GAD 7 : Generalized Anxiety Score 08/28/2021 05/22/2021 01/15/2021 01/05/2020  Nervous, Anxious, on Edge 1 2 0 1  Control/stop worrying 0 2 0 1  Worry too much - different things 0 2 1 1   Trouble relaxing 1 1 0 1  Restless 0 1 0 1  Easily annoyed or irritable 1 2 1 1   Afraid - awful might happen 0 0 0 0  Total GAD 7 Score 3 10 2 6   Anxiety Difficulty - - - Not difficult at all     Review of Systems:   Pertinent items are noted in HPI Denies abnormal vaginal discharge w/ itching/odor/irritation, headaches, visual changes, shortness of breath, chest pain, abdominal pain, severe nausea/vomiting, or problems with urination or bowel movements unless otherwise stated above. Pertinent History Reviewed:  Reviewed past medical,surgical, social, obstetrical and family history.  Reviewed problem list, medications and  allergies. Physical Assessment:   Vitals:   09/25/21 1112  BP: 113/75  Pulse: 93  Weight: 193 lb (87.5 kg)  Body mass index is 33.13 kg/m.           Physical Examination:   General appearance: alert, well appearing, and in no distress  Mental status: alert, oriented to person, place, and time  Skin: warm & dry   Extremities: Edema: None    Cardiovascular: normal heart rate noted  Respiratory: normal respiratory effort, no distress  Abdomen: gravid, soft, non-tender  Pelvic: Cervical exam deferred         Fetal Status: Fetal Heart Rate (bpm): 145 Fundal Height: 30 cm Movement: Present    Fetal Surveillance Testing today: doppler   Chaperone: N/A    Results for orders placed or performed in visit on 09/25/21 (from the past 24 hour(s))  POC Urinalysis Dipstick OB   Collection Time: 09/25/21 11:20 AM  Result Value Ref Range   Color, UA     Clarity, UA     Glucose, UA Negative Negative   Bilirubin, UA     Ketones, UA neg    Spec Grav, UA     Blood, UA trace    pH, UA     POC,PROTEIN,UA Negative Negative, Trace,  Small (1+), Moderate (2+), Large (3+), 4+   Urobilinogen, UA     Nitrite, UA neg    Leukocytes, UA Trace (A) Negative   Appearance     Odor      Assessment & Plan:  High-risk pregnancy: [id number] at [redacted]w[redacted]d with an Estimated Date of Delivery: 11/29/21   1) CHTN, stable on norvasc 5mg  daily, ASA  2) A1DM, stable  3) H/O 36.5wk PPROM/PTB  Meds: No orders of the defined types were placed in this encounter.   Labs/procedures today: tdap  Treatment Plan:  Growth u/s q 4wks    2x/wk testing nst/sono @ 32wks     Deliver 38-39wks (37wks or prn if poor control)____   Reviewed: Preterm labor symptoms and general obstetric precautions including but not limited to vaginal bleeding, contractions, leaking of fluid and fetal movement were reviewed in detail with the patient.  All questions were answered. Does have home bp cuff. Office bp cuff given: not applicable. Check bp  weekly, let us know if consistently >150 and/or >95.  Follow-up: Return for 2/9 HROB w/ MD/CNM w/ EFW/AFI/BPP/DOPP u/s, then mon nst/nurse, thurs bpp/dopp hrob w/ md/cnm til 39.   No future appointments.  Orders Placed This Encounter  Procedures   POC Urinalysis Dipstick OB   [place], [facility] 09/25/2021 11:42 AM

## 2021-09-27 ENCOUNTER — Encounter: Payer: Self-pay | Admitting: Women's Health

## 2021-09-27 ENCOUNTER — Other Ambulatory Visit: Payer: Self-pay | Admitting: Women's Health

## 2021-09-27 MED ORDER — PRENATAL PLUS 27-1 MG PO TABS: 1.0000 | ORAL_TABLET | Freq: Every day | ORAL | 12 refills | Status: DC

## 2021-10-01 ENCOUNTER — Ambulatory Visit: Payer: 59

## 2021-10-02 ENCOUNTER — Other Ambulatory Visit: Payer: Self-pay

## 2021-10-02 ENCOUNTER — Ambulatory Visit (INDEPENDENT_AMBULATORY_CARE_PROVIDER_SITE_OTHER): Payer: 59

## 2021-10-02 VITALS — BP 117/77 | HR 95 | Wt 190.0 lb

## 2021-10-02 DIAGNOSIS — Z1389 Encounter for screening for other disorder: Secondary | ICD-10-CM | POA: Diagnosis not present

## 2021-10-02 DIAGNOSIS — Z3689 Encounter for other specified antenatal screening: Secondary | ICD-10-CM

## 2021-10-02 DIAGNOSIS — Z3A31 31 weeks gestation of pregnancy: Secondary | ICD-10-CM

## 2021-10-02 DIAGNOSIS — Z331 Pregnant state, incidental: Secondary | ICD-10-CM

## 2021-10-02 LAB — POCT URINALYSIS DIPSTICK OB
Blood, UA: NEGATIVE
Glucose, UA: NEGATIVE
Ketones, UA: NEGATIVE
Leukocytes, UA: NEGATIVE
Nitrite, UA: NEGATIVE
POC,PROTEIN,UA: NEGATIVE

## 2021-10-02 NOTE — Progress Notes (Addendum)
? ?  NURSE VISIT- NST ? ?SUBJECTIVE:  ?Meghan Phelps is a 31 y.o. [phone] female at [redacted]w[redacted]d, here for a NST for pregnancy complicated by [facility] and [id number] currently on dietary changes .  She reports active fetal movement, contractions: none, vaginal bleeding: none, membranes: intact.  ? ?OBJECTIVE:  ?BP 117/77   Pulse 95   Wt 190 lb (86.2 kg)   LMP 02/22/2021 (Exact Date)   BMI 32.61 kg/m?   ?Appears well, no apparent distress ? ?Results for orders placed or performed in visit on 10/02/21 (from the past 24 hour(s))  ?POC Urinalysis Dipstick OB  ? Collection Time: 10/02/21 11:15 AM  ?Result Value Ref Range  ? Color, UA    ? Clarity, UA    ? Glucose, UA Negative Negative  ? Bilirubin, UA    ? Ketones, UA neg   ? Spec Grav, UA    ? Blood, UA neg   ? pH, UA    ? POC,PROTEIN,UA Negative Negative, Trace, Small (1+), Moderate (2+), Large (3+), 4+  ? Urobilinogen, UA    ? Nitrite, UA neg   ? Leukocytes, UA Negative Negative  ? Appearance    ? Odor    ? ? ?NST: FHR baseline 120 bpm, Variability: moderate, Accelerations:present, Decelerations:  Absent= Cat 1/reactive ?Toco: none  ? ?ASSESSMENT: ?[id number] at [redacted]w[redacted]d with CHTN and A2DM currently on dietary changes ?NST reactive ? ?PLAN: ?EFM strip interpreted & reviewed by Meghan Phelps, CNM, WHNP   ?Recommendations: keep next appointment as scheduled   ? ?Meghan Phelps A Hertha Gergen  ?10/02/2021 ?2:28 PM  ?

## 2021-10-05 ENCOUNTER — Other Ambulatory Visit: Payer: Self-pay

## 2021-10-05 ENCOUNTER — Ambulatory Visit (INDEPENDENT_AMBULATORY_CARE_PROVIDER_SITE_OTHER): Payer: 59 | Admitting: *Deleted

## 2021-10-05 VITALS — BP 125/80 | HR 86 | Wt 190.4 lb

## 2021-10-05 DIAGNOSIS — O10013 Pre-existing essential hypertension complicating pregnancy, third trimester: Secondary | ICD-10-CM

## 2021-10-05 DIAGNOSIS — Z3A32 32 weeks gestation of pregnancy: Secondary | ICD-10-CM

## 2021-10-05 DIAGNOSIS — I10 Essential (primary) hypertension: Secondary | ICD-10-CM

## 2021-10-05 DIAGNOSIS — O0993 Supervision of high risk pregnancy, unspecified, third trimester: Secondary | ICD-10-CM

## 2021-10-05 DIAGNOSIS — O24410 Gestational diabetes mellitus in pregnancy, diet controlled: Secondary | ICD-10-CM

## 2021-10-05 LAB — POCT URINALYSIS DIPSTICK OB
Blood, UA: NEGATIVE
Glucose, UA: NEGATIVE
Ketones, UA: NEGATIVE
Leukocytes, UA: NEGATIVE
Nitrite, UA: NEGATIVE
POC,PROTEIN,UA: NEGATIVE

## 2021-10-05 NOTE — Progress Notes (Signed)
? ?  NURSE VISIT- NST ? ?SUBJECTIVE:  ?Meghan Phelps is a 31 y.o. [phone] female at [redacted]w[redacted]d, here for a NST for pregnancy complicated by [facility] and A2DM currently on no meds .  She reports active fetal movement, contractions: none, vaginal bleeding: none, membranes: intact.  ? ?OBJECTIVE:  ?BP 125/80   Pulse 86   Wt 190 lb 6.4 oz (86.4 kg)   LMP 02/22/2021 (Exact Date)   BMI 32.68 kg/m?   ?Appears well, no apparent distress ? ?Results for orders placed or performed in visit on 10/05/21 (from the past 24 hour(s))  ?POC Urinalysis Dipstick OB  ? Collection Time: 10/05/21 10:17 AM  ?Result Value Ref Range  ? Color, UA    ? Clarity, UA    ? Glucose, UA Negative Negative  ? Bilirubin, UA    ? Ketones, UA neg   ? Spec Grav, UA    ? Blood, UA neg   ? pH, UA    ? POC,PROTEIN,UA Negative Negative, Trace, Small (1+), Moderate (2+), Large (3+), 4+  ? Urobilinogen, UA    ? Nitrite, UA neg   ? Leukocytes, UA Negative Negative  ? Appearance    ? Odor    ? ? ?NST: FHR baseline 130 bpm, Variability: moderate, Accelerations:present, Decelerations:  Absent= Cat 1/reactive ?Toco: none  ? ?ASSESSMENT: ?[id number] at [redacted]w[redacted]d with CHTN and A2DM currently on no meds ?NST reactive ? ?PLAN: ?EFM strip reviewed by Dr. Despina Hidden   ?Recommendations: keep next appointment as scheduled   ? ?Annamarie Dawley  ?10/05/2021 ?10:17 AM  ?

## 2021-10-08 ENCOUNTER — Other Ambulatory Visit: Payer: Self-pay | Admitting: Women's Health

## 2021-10-08 ENCOUNTER — Ambulatory Visit: Payer: 59

## 2021-10-08 DIAGNOSIS — O24410 Gestational diabetes mellitus in pregnancy, diet controlled: Secondary | ICD-10-CM

## 2021-10-08 DIAGNOSIS — O10919 Unspecified pre-existing hypertension complicating pregnancy, unspecified trimester: Secondary | ICD-10-CM

## 2021-10-09 ENCOUNTER — Ambulatory Visit (INDEPENDENT_AMBULATORY_CARE_PROVIDER_SITE_OTHER): Payer: 59

## 2021-10-09 ENCOUNTER — Encounter: Payer: Self-pay | Admitting: Obstetrics & Gynecology

## 2021-10-09 ENCOUNTER — Other Ambulatory Visit: Payer: Self-pay

## 2021-10-09 ENCOUNTER — Ambulatory Visit (INDEPENDENT_AMBULATORY_CARE_PROVIDER_SITE_OTHER): Payer: 59 | Admitting: Obstetrics & Gynecology

## 2021-10-09 VITALS — BP 116/78 | HR 89 | Wt 194.0 lb

## 2021-10-09 DIAGNOSIS — O10919 Unspecified pre-existing hypertension complicating pregnancy, unspecified trimester: Secondary | ICD-10-CM

## 2021-10-09 DIAGNOSIS — Z3A32 32 weeks gestation of pregnancy: Secondary | ICD-10-CM | POA: Diagnosis not present

## 2021-10-09 DIAGNOSIS — O24410 Gestational diabetes mellitus in pregnancy, diet controlled: Secondary | ICD-10-CM | POA: Diagnosis not present

## 2021-10-09 DIAGNOSIS — O0993 Supervision of high risk pregnancy, unspecified, third trimester: Secondary | ICD-10-CM

## 2021-10-09 DIAGNOSIS — I10 Essential (primary) hypertension: Secondary | ICD-10-CM

## 2021-10-09 LAB — POCT URINALYSIS DIPSTICK OB
Blood, UA: NEGATIVE
Glucose, UA: NEGATIVE
Ketones, UA: NEGATIVE
Leukocytes, UA: NEGATIVE
Nitrite, UA: NEGATIVE
POC,PROTEIN,UA: NEGATIVE

## 2021-10-09 NOTE — Progress Notes (Signed)
[place] 32+5 wks,cephalic,BPP 8/8,FHR 138 bpm,left lateral placenta gr 0,LVEICF 2.9 mm,AFI 14 cm,RI .55,.56,.49,.40=5th%,EFW 2511 g 93% ?

## 2021-10-09 NOTE — Progress Notes (Signed)
? ?HIGH-RISK PREGNANCY VISIT ?Patient name: Meghan Phelps MRN [id number]  Date of birth: 09-Sep-1990 ?Chief Complaint:   ?Routine Prenatal Visit ? ?History of Present Illness:   ?Meghan Phelps is a 31 y.o. [phone] female at [redacted]w[redacted]d with an Estimated Date of Delivery: 11/29/21 being seen today for ongoing management of a high-risk pregnancy complicated by chronic hypertension currently on norvasc 2.5 mg and ASA 81 mg and diabetes mellitus A1DM.   ? ?Today she reports no complaints. Contractions: Not present. Vag. Bleeding: None.  Movement: Present. denies leaking of fluid.  ? ?Depression screen [facility] 2/9 09/13/2021 08/28/2021 05/22/2021 01/15/2021 01/05/2020  ?Decreased Interest 0 0 2 1 1   ?Down, Depressed, Hopeless 0 0 1 0 1  ?PHQ - 2 Score 0 0 3 1 2   ?Altered sleeping - 1 3 1 2   ?Tired, decreased energy - 1 3 1 1   ?Change in appetite - 0 3 1 0  ?Feeling bad or failure about yourself  - 0 0 0 1  ?Trouble concentrating - 0 0 0 0  ?Moving slowly or fidgety/restless - 0 0 0 0  ?Suicidal thoughts - 0 0 0 0  ?PHQ-9 Score - 2 12 4 6   ?Difficult doing work/chores - - - - Not difficult at all  ? ?  ?GAD 7 : Generalized Anxiety Score 08/28/2021 05/22/2021 01/15/2021 01/05/2020  ?Nervous, Anxious, on Edge 1 2 0 1  ?Control/stop worrying 0 2 0 1  ?Worry too much - different things 0 2 1 1   ?Trouble relaxing 1 1 0 1  ?Restless 0 1 0 1  ?Easily annoyed or irritable 1 2 1 1   ?Afraid - awful might happen 0 0 0 0  ?Total GAD 7 Score 3 10 2 6   ?Anxiety Difficulty - - - Not difficult at all  ? ? ? ?Review of Systems:   ?Pertinent items are noted in HPI ?Denies abnormal vaginal discharge w/ itching/odor/irritation, headaches, visual changes, shortness of breath, chest pain, abdominal pain, severe nausea/vomiting, or problems with urination or bowel movements unless otherwise stated above. ?Pertinent History Reviewed:  ?Reviewed past medical,surgical, social, obstetrical and family history.  ?Reviewed problem list, medications and allergies. ?Physical  Assessment:  ? ?Vitals:  ? 10/09/21 1512  ?BP: 116/78  ?Pulse: 89  ?Weight: 194 lb (88 kg)  ?Body mass index is 33.3 kg/m?. ?     ?     Physical Examination:  ? General appearance: alert, well appearing, and in no distress ? Mental status: alert, oriented to person, place, and time ? Skin: warm & dry  ? Extremities: Edema: None  ?  Cardiovascular: normal heart rate noted ? Respiratory: normal respiratory effort, no distress ? Abdomen: gravid, soft, non-tender ? Pelvic: Cervical exam deferred        ? ?Fetal Status: Fetal Heart Rate (bpm): 154   Movement: Present   ? ?Fetal Surveillance Testing today: BPP 8/8 with goodDoppler flow  ? ?Chaperone: N/A   ? ?Results for orders placed or performed in visit on 10/09/21 (from the past 24 hour(s))  ?POC Urinalysis Dipstick OB  ? Collection Time: 10/09/21  3:14 PM  ?Result Value Ref Range  ? Color, UA    ? Clarity, UA    ? Glucose, UA Negative Negative  ? Bilirubin, UA    ? Ketones, UA neg   ? Spec Grav, UA    ? Blood, UA neg   ? pH, UA    ? POC,PROTEIN,UA Negative Negative, Trace, Small (1+), Moderate (  2+), Large (3+), 4+  ? Urobilinogen, UA    ? Nitrite, UA neg   ? Leukocytes, UA Negative Negative  ? Appearance    ? Odor    ?  ?Assessment & Plan:  ?High-risk pregnancy: [id number] at [redacted]w[redacted]d with an Estimated Date of Delivery: 11/29/21  ? ?1) CHTN on norvasc 10 and ASa 81 MG, stable ? ?2) A1DM, good control EFW 93%,  ? ?Meds: No orders of the defined types were placed in this encounter. ? ? ?Labs/procedures today: U/S ? ?Treatment Plan:  twice weekly surveillance ? ?Reviewed: Preterm labor symptoms and general obstetric precautions including but not limited to vaginal bleeding, contractions, leaking of fluid and fetal movement were reviewed in detail with the patient.  All questions were answered. Does have home bp cuff. Office bp cuff given: yes. Check bp daily, let us know if consistently >140 and/or >90. ? ?Follow-up: Return for keep scheduled. ? ? ?Future Appointments  ?Date  Time Provider [place]  ?10/12/2021 11:10 AM [facility] NURSE [facility]  ?10/16/2021  3:30 PM [place] - [facility] [place] [facility] None  ?10/16/2021  4:30 PM Janyth Pupa, DO [facility]  ?10/19/2021  9:10 AM [facility] NURSE [facility]  ?10/23/2021 11:30 AM [facility] [place] [facility] None  ?10/23/2021  1:50 PM Roma Schanz, CNM [facility]  ?10/26/2021  9:10 AM [facility] NURSE [facility]  ?10/30/2021  8:30 AM [facility] [place] [facility] None  ?10/30/2021  9:30 AM Roma Schanz, CNM [facility]  ?11/06/2021  9:15 AM [facility] [place] [facility] None  ?11/06/2021 10:30 AM Roma Schanz, CNM [facility]  ?11/09/2021 10:30 AM [facility] NURSE [facility]  ?11/13/2021  9:45 AM [facility] - [facility] [place] [facility] None  ?11/13/2021 10:30 AM Janyth Pupa, DO [facility]  ?11/16/2021  9:30 AM [facility] NURSE [facility]  ?11/20/2021  9:15 AM [facility] [place] [facility] None  ?11/20/2021 10:10 AM Roma Schanz, CNM [facility]  ?11/23/2021  9:10 AM [facility] NURSE [facility]  ?11/27/2021  8:30 AM [facility] - [facility] [place] [facility] None  ?11/27/2021  9:30 AM Roma Schanz, CNM [facility]  ? ? ?Orders Placed This Encounter  ?Procedures  ? POC Urinalysis Dipstick OB  ? ?[place]  ?10/09/2021 ?4:02 PM ? ?

## 2021-10-12 ENCOUNTER — Other Ambulatory Visit: Payer: Self-pay

## 2021-10-12 ENCOUNTER — Ambulatory Visit (INDEPENDENT_AMBULATORY_CARE_PROVIDER_SITE_OTHER): Payer: 59 | Admitting: *Deleted

## 2021-10-12 VITALS — BP 112/72 | HR 92 | Wt 192.0 lb

## 2021-10-12 DIAGNOSIS — Z1389 Encounter for screening for other disorder: Secondary | ICD-10-CM

## 2021-10-12 DIAGNOSIS — Z331 Pregnant state, incidental: Secondary | ICD-10-CM

## 2021-10-12 DIAGNOSIS — Z3A33 33 weeks gestation of pregnancy: Secondary | ICD-10-CM

## 2021-10-12 DIAGNOSIS — I10 Essential (primary) hypertension: Secondary | ICD-10-CM

## 2021-10-12 DIAGNOSIS — O288 Other abnormal findings on antenatal screening of mother: Secondary | ICD-10-CM

## 2021-10-12 DIAGNOSIS — O0993 Supervision of high risk pregnancy, unspecified, third trimester: Secondary | ICD-10-CM | POA: Diagnosis not present

## 2021-10-12 LAB — POCT URINALYSIS DIPSTICK OB
Blood, UA: NEGATIVE
Glucose, UA: NEGATIVE
Ketones, UA: NEGATIVE
Leukocytes, UA: NEGATIVE
Nitrite, UA: NEGATIVE
POC,PROTEIN,UA: NEGATIVE

## 2021-10-12 NOTE — Progress Notes (Signed)
? ?  NURSE VISIT- NST ? ?SUBJECTIVE:  ?Meghan Phelps is a 31 y.o. [phone] female at [redacted]w[redacted]d, here for a NST for pregnancy complicated by [facility].  She reports active fetal movement, contractions: irritability, vaginal bleeding: none, membranes: intact.  ? ?OBJECTIVE:  ?BP 112/72   Pulse 92   Wt 192 lb (87.1 kg)   LMP 02/22/2021 (Exact Date)   BMI 32.96 kg/m?   ?Appears well, no apparent distress ? ?Results for orders placed or performed in visit on 10/12/21 (from the past 24 hour(s))  ?POC Urinalysis Dipstick OB  ? Collection Time: 10/12/21 11:31 AM  ?Result Value Ref Range  ? Color, UA    ? Clarity, UA    ? Glucose, UA Negative Negative  ? Bilirubin, UA    ? Ketones, UA neg   ? Spec Grav, UA    ? Blood, UA neg   ? pH, UA    ? POC,PROTEIN,UA Negative Negative, Trace, Small (1+), Moderate (2+), Large (3+), 4+  ? Urobilinogen, UA    ? Nitrite, UA neg   ? Leukocytes, UA Negative Negative  ? Appearance    ? Odor    ? ? ?NST: FHR baseline 135 bpm, Variability: moderate, Accelerations:present, Decelerations:  Absent= Cat 1/reactive ?Toco: Uterine irritability  ? ?ASSESSMENT: ?[id number] at [redacted]w[redacted]d with CHTN ?NST reactive ? ?PLAN: ?EFM strip reviewed by Joellyn Haff, CNM, WHNP   ?Recommendations: keep next appointment as scheduled   ? ?Debbe Odea Chapman Matteucci  ?10/12/2021 ?12:35 PM ? ?

## 2021-10-15 ENCOUNTER — Ambulatory Visit: Payer: 59

## 2021-10-15 ENCOUNTER — Other Ambulatory Visit: Payer: Self-pay | Admitting: Obstetrics & Gynecology

## 2021-10-15 DIAGNOSIS — O24410 Gestational diabetes mellitus in pregnancy, diet controlled: Secondary | ICD-10-CM

## 2021-10-15 DIAGNOSIS — O10919 Unspecified pre-existing hypertension complicating pregnancy, unspecified trimester: Secondary | ICD-10-CM

## 2021-10-16 ENCOUNTER — Ambulatory Visit (INDEPENDENT_AMBULATORY_CARE_PROVIDER_SITE_OTHER): Payer: 59 | Admitting: Obstetrics & Gynecology

## 2021-10-16 ENCOUNTER — Other Ambulatory Visit: Payer: Self-pay

## 2021-10-16 ENCOUNTER — Encounter: Payer: Self-pay | Admitting: Obstetrics & Gynecology

## 2021-10-16 ENCOUNTER — Ambulatory Visit (INDEPENDENT_AMBULATORY_CARE_PROVIDER_SITE_OTHER): Payer: 59

## 2021-10-16 ENCOUNTER — Other Ambulatory Visit: Payer: 59

## 2021-10-16 VITALS — BP 116/76 | HR 93 | Wt 194.8 lb

## 2021-10-16 DIAGNOSIS — O24410 Gestational diabetes mellitus in pregnancy, diet controlled: Secondary | ICD-10-CM | POA: Diagnosis not present

## 2021-10-16 DIAGNOSIS — O10919 Unspecified pre-existing hypertension complicating pregnancy, unspecified trimester: Secondary | ICD-10-CM | POA: Diagnosis not present

## 2021-10-16 DIAGNOSIS — Z3A33 33 weeks gestation of pregnancy: Secondary | ICD-10-CM

## 2021-10-16 DIAGNOSIS — O24419 Gestational diabetes mellitus in pregnancy, unspecified control: Secondary | ICD-10-CM

## 2021-10-16 DIAGNOSIS — O0993 Supervision of high risk pregnancy, unspecified, third trimester: Secondary | ICD-10-CM

## 2021-10-16 LAB — POCT URINALYSIS DIPSTICK OB
Blood, UA: NEGATIVE
Glucose, UA: NEGATIVE
Ketones, UA: NEGATIVE
Leukocytes, UA: NEGATIVE
Nitrite, UA: NEGATIVE
POC,PROTEIN,UA: NEGATIVE

## 2021-10-16 NOTE — Progress Notes (Signed)
[place] 33+5 wks,cephalic,left lateral placenta gr 0,fhr 138 bpm,RI .51,.60,.47,.54=25%,LVEICF,AFI 17 cm,BPP 8/8 ?

## 2021-10-16 NOTE — Progress Notes (Signed)
? ?HIGH-RISK PREGNANCY VISIT ?Patient name: Meghan Phelps MRN [id number]  Date of birth: 1990/08/21 ?Chief Complaint:   ?Routine Prenatal Visit ? ?History of Present Illness:   ?Meghan Phelps is a 31 y.o. [phone] female at 78w5dwith an Estimated Date of Delivery: 11/29/21 being seen today for ongoing management of a high-risk pregnancy complicated by: ? ?-cHTN- no issues with current medications ?-GDM - <50% elevated, pt notes it was her baby shower this week and knew that her sugars would be slightly higher due to her diet ? ?Today she reports no complaints.  ? ?Contractions: Not present. Vag. Bleeding: None.  Movement: Present. denies leaking of fluid.  ? ?[facility] 09/13/2021 08/28/2021 05/22/2021 01/15/2021 01/05/2020  ?Decreased Interest 0 0 _0 ?Down, Depressed, Hopeless 0 0 1 0 1  ?PHQ - 2 Score 0 0 _1 ?Altered sleeping - _2 ?Tired, decreased energy - _3 ?Change in appetite - 0 3 1 0  ?Feeling bad or failure about yourself  - 0 0 0 1  ?Trouble concentrating - 0 0 0 0  ?Moving slowly or fidgety/restless - 0 0 0 0  ?Suicidal thoughts - 0 0 0 0  ?PHQ-9 Score - _4 ?Difficult doing work/chores - - - - Not difficult at all  ? ? ? ?Current Outpatient Medications  ?Medication Instructions  ? amLODipine (NORVASC) 5 mg, Oral, Daily  ? aspirin 162 mg, Oral, Daily, Swallow whole.  ? Blood Glucose Monitoring Suppl (ONETOUCH VERIO) w/Device KIT Use as directed to check blood sugar 4 times daily  ? glucose blood test strip Use as instructed  ? Lancets (ONETOUCH ULTRASOFT) lancets Use as directed to check blood sugar 4 times daily  ? prenatal vitamin w/FE, FA (PRENATAL 1 + 1) 27-1 MG TABS tablet 1 tablet, Oral, Daily  ? promethazine (PHENERGAN) 25 mg, Oral, Every 6 hours PRN  ?  ? ?Review of Systems:   ?Pertinent items are noted in HPI ?Denies abnormal vaginal discharge w/ itching/odor/irritation, headaches, visual changes, shortness of breath, chest pain, abdominal pain, severe  nausea/vomiting, or problems with urination or bowel movements unless otherwise stated above. ?Pertinent History Reviewed:  ?Reviewed past medical,surgical, social, obstetrical and family history.  ?Reviewed problem list, medications and allergies. ?Physical Assessment:  ? ?Vitals:  ? 10/16/21 1634  ?BP: 116/76  ?Pulse: 93  ?Weight: 194 lb 12.8 oz (88.4 kg)  ?Body mass index is 33.44 kg/m?. ?     ?     Physical Examination:  ? General appearance: alert, well appearing, and in no distress ? Mental status: normal mood, behavior, speech, dress, motor activity, and thought processes ? Skin: warm & dry  ? Extremities: Edema: None  ?  Cardiovascular: normal heart rate noted ? Respiratory: normal respiratory effort, no distress ? Abdomen: gravid, soft, non-tender ? Pelvic: Cervical exam deferred        ? ?Fetal Status:     Movement: Present   ? ?Fetal Surveillance Testing today: cephalic,left lateral placenta gr 0,fhr 138 bpm,RI .51,.60,.47,.54=25%,LVEICF,AFI 17 cm,BPP 8/8  ? ?Chaperone: N/A   ? ?Results for orders placed or performed in visit on 10/16/21 (from the past 24 hour(s))  ?POC Urinalysis Dipstick OB  ? Collection Time: 10/16/21  4:26 PM  ?Result Value Ref Range  ? Color, UA    ? Clarity, UA    ? Glucose, UA Negative Negative  ? Bilirubin, UA    ?  Ketones, UA neg   ? Spec Grav, UA    ? Blood, UA neg   ? pH, UA    ? POC,PROTEIN,UA Negative Negative, Trace, Small (1+), Moderate (2+), Large (3+), 4+  ? Urobilinogen, UA    ? Nitrite, UA neg   ? Leukocytes, UA Negative Negative  ? Appearance    ? Odor    ?  ? ?Assessment & Plan:  ?High-risk pregnancy: G3P0111 at 66w5dwith an Estimated Date of Delivery: 11/29/21  ? ?1) GDMA1 ?- pt aware and plans to get back on track this week ?-continue growth q 4wks ? ?2) HTN ?-doing well with current medication ?-continue antepartum testing  ?-pt aware of early IOL ? ?Meds: No orders of the defined types were placed in this encounter. ? ?Labs/procedures today: BPP ? ?Treatment Plan:   continue as outlined above ? ?Reviewed: Preterm labor symptoms and general obstetric precautions including but not limited to vaginal bleeding, contractions, leaking of fluid and fetal movement were reviewed in detail with the patient.  All questions were answered. Pt has home bp cuff. Check bp weekly, let u[place]ore[place]now if >140/90.  ? ?Follow-up: Return for as scheduled- twice weekly. ? ? ?Future Appointments  ?Date Time Provider DBlanco ?10/19/2021  9:10 AM [facility] NURSE [facility]  ?10/23/2021 11:30 AM [facility] [place] None  ?10/23/2021  1:50 PM BRoma Schanz CNM [facility]  ?10/26/2021  9:10 AM [facility] NURSE [facility]  ?10/30/2021  8:30 AM [facility] [place]G None  ?10/30/2021  9:30 AM BRoma Schanz CNM [facility]  ?11/06/2021  9:15 AM [facility] [place] None  ?11/06/2021 10:30 AM BRoma Schanz CNM [facility]  ?11/09/2021 10:30 AM [facility] NURSE [facility]  ?11/13/2021  9:45 AM [facility] [place] None  ?11/13/2021 10:30 AM OJanyth Pupa DO [facility]  ?11/16/2021  9:30 AM [facility] NURSE [facility]  ?11/20/2021  9:15 AM [facility] [place] None  ?11/20/2021 10:10 AM BRoma Schanz CNM [facility]  ?11/23/2021  9:10 AM [facility] NURSE [facility]  ?11/27/2021  8:30 AM [facility] [place] None  ?11/27/2021  9:30 AM BRoma Schanz CNM [facility]  ? ? ?Orders Placed This Encounter  ?Procedures  ? POC Urinalysis Dipstick OB  ? ? ?JJanyth Pupa DO ?[facility] [place]? ? ? ?

## 2021-10-17 ENCOUNTER — Other Ambulatory Visit: Payer: Self-pay | Admitting: Adult Health

## 2021-10-19 ENCOUNTER — Ambulatory Visit (INDEPENDENT_AMBULATORY_CARE_PROVIDER_SITE_OTHER): Payer: 59 | Admitting: *Deleted

## 2021-10-19 ENCOUNTER — Other Ambulatory Visit: Payer: Self-pay

## 2021-10-19 VITALS — BP 117/75 | HR 94 | Wt 190.0 lb

## 2021-10-19 DIAGNOSIS — I10 Essential (primary) hypertension: Secondary | ICD-10-CM

## 2021-10-19 DIAGNOSIS — O0993 Supervision of high risk pregnancy, unspecified, third trimester: Secondary | ICD-10-CM

## 2021-10-19 DIAGNOSIS — O24419 Gestational diabetes mellitus in pregnancy, unspecified control: Secondary | ICD-10-CM

## 2021-10-19 DIAGNOSIS — O288 Other abnormal findings on antenatal screening of mother: Secondary | ICD-10-CM

## 2021-10-19 DIAGNOSIS — Z331 Pregnant state, incidental: Secondary | ICD-10-CM

## 2021-10-19 DIAGNOSIS — Z1389 Encounter for screening for other disorder: Secondary | ICD-10-CM

## 2021-10-19 LAB — POCT URINALYSIS DIPSTICK OB
Blood, UA: NEGATIVE
Glucose, UA: NEGATIVE
Ketones, UA: NEGATIVE
Leukocytes, UA: NEGATIVE
Nitrite, UA: NEGATIVE

## 2021-10-19 NOTE — Progress Notes (Signed)
? ?  NURSE VISIT- NST ? ?SUBJECTIVE:  ?Meghan Phelps is a 30 y.o. [phone] female at [redacted]w[redacted]d, here for a NST for pregnancy complicated by [facility] and [id number].  She reports active fetal movement, contractions: irritability, vaginal bleeding: none, membranes: intact.  ? ?OBJECTIVE:  ?BP 117/75   Pulse 94   Wt 190 lb (86.2 kg)   LMP 02/22/2021 (Exact Date)   BMI 32.61 kg/m?   ?Appears well, no apparent distress ? ?Results for orders placed or performed in visit on 10/19/21 (from the past 24 hour(s))  ?POC Urinalysis Dipstick OB  ? Collection Time: 10/19/21  9:25 AM  ?Result Value Ref Range  ? Color, UA    ? Clarity, UA    ? Glucose, UA Negative Negative  ? Bilirubin, UA    ? Ketones, UA neg   ? Spec Grav, UA    ? Blood, UA neg   ? pH, UA    ? POC,PROTEIN,UA Trace Negative, Trace, Small (1+), Moderate (2+), Large (3+), 4+  ? Urobilinogen, UA    ? Nitrite, UA neg   ? Leukocytes, UA Negative Negative  ? Appearance    ? Odor    ? ? ?NST: FHR baseline 130 bpm, Variability: moderate, Accelerations:present, Decelerations:  Absent= Cat 1/reactive ?Toco: none  ? ?ASSESSMENT: ?[id number] at [redacted]w[redacted]d with CHTN and A1DM ?NST reactive ? ?PLAN: ?EFM strip reviewed by Derrill Memo, CNM   ?Recommendations: keep next appointment as scheduled   ? ?Meghan Phelps  ?10/19/2021 ?10:43 AM ? ?

## 2021-10-22 ENCOUNTER — Ambulatory Visit: Payer: 59

## 2021-10-22 ENCOUNTER — Other Ambulatory Visit: Payer: Self-pay | Admitting: Obstetrics & Gynecology

## 2021-10-22 DIAGNOSIS — O10919 Unspecified pre-existing hypertension complicating pregnancy, unspecified trimester: Secondary | ICD-10-CM

## 2021-10-22 DIAGNOSIS — O24410 Gestational diabetes mellitus in pregnancy, diet controlled: Secondary | ICD-10-CM

## 2021-10-23 ENCOUNTER — Ambulatory Visit (INDEPENDENT_AMBULATORY_CARE_PROVIDER_SITE_OTHER): Payer: 59 | Admitting: Women's Health

## 2021-10-23 ENCOUNTER — Ambulatory Visit (INDEPENDENT_AMBULATORY_CARE_PROVIDER_SITE_OTHER): Payer: 59

## 2021-10-23 ENCOUNTER — Encounter: Payer: Self-pay | Admitting: Women's Health

## 2021-10-23 ENCOUNTER — Other Ambulatory Visit: Payer: 59

## 2021-10-23 ENCOUNTER — Other Ambulatory Visit: Payer: Self-pay

## 2021-10-23 VITALS — BP 117/75 | HR 88 | Wt 194.0 lb

## 2021-10-23 DIAGNOSIS — O0993 Supervision of high risk pregnancy, unspecified, third trimester: Secondary | ICD-10-CM

## 2021-10-23 DIAGNOSIS — O24410 Gestational diabetes mellitus in pregnancy, diet controlled: Secondary | ICD-10-CM

## 2021-10-23 DIAGNOSIS — Z3A34 34 weeks gestation of pregnancy: Secondary | ICD-10-CM

## 2021-10-23 DIAGNOSIS — Z029 Encounter for administrative examinations, unspecified: Secondary | ICD-10-CM

## 2021-10-23 DIAGNOSIS — O10919 Unspecified pre-existing hypertension complicating pregnancy, unspecified trimester: Secondary | ICD-10-CM

## 2021-10-23 DIAGNOSIS — I10 Essential (primary) hypertension: Secondary | ICD-10-CM

## 2021-10-23 DIAGNOSIS — B009 Herpesviral infection, unspecified: Secondary | ICD-10-CM

## 2021-10-23 LAB — POCT URINALYSIS DIPSTICK OB
Blood, UA: NEGATIVE
Glucose, UA: NEGATIVE
Ketones, UA: NEGATIVE
Leukocytes, UA: NEGATIVE
Nitrite, UA: NEGATIVE
POC,PROTEIN,UA: NEGATIVE

## 2021-10-23 MED ORDER — ACYCLOVIR 400 MG PO TABS: 400.0000 mg | ORAL_TABLET | Freq: Three times a day (TID) | ORAL | 3 refills | Status: DC

## 2021-10-23 NOTE — Progress Notes (Signed)
? ?HIGH-RISK PREGNANCY VISIT ?Patient name: Meghan Phelps MRN [id number]  Date of birth: 06-Feb-1991 ?Chief Complaint:   ?Routine Prenatal Visit ? ?History of Present Illness:   ?Meghan Phelps is a 31 y.o. [phone] female at [redacted]w[redacted]d with an Estimated Date of Delivery: 11/29/21 being seen today for ongoing management of a high-risk pregnancy complicated by chronic hypertension currently on norvasc 5mg  daily and diabetes mellitus A1DM.   ? ?Today she reports  all sugars wnl except one 2hr pp (130s, ate more than she should) . Contractions: Irritability. Vag. Bleeding: None.  Movement: Present. denies leaking of fluid.  ? ? ?  09/13/2021  ?  8:17 PM 08/28/2021  ?  9:10 AM 05/22/2021  ?  9:25 AM 01/15/2021  ?  8:37 AM 01/05/2020  ?  9:43 AM  ?Depression screen PHQ 2/9  ?Decreased Interest 0 0 2 1 1   ?Down, Depressed, Hopeless 0 0 1 0 1  ?PHQ - 2 Score 0 0 3 1 2   ?Altered sleeping  1 3 1 2   ?Tired, decreased energy  1 3 1 1   ?Change in appetite  0 3 1 0  ?Feeling bad or failure about yourself   0 0 0 1  ?Trouble concentrating  0 0 0 0  ?Moving slowly or fidgety/restless  0 0 0 0  ?Suicidal thoughts  0 0 0 0  ?PHQ-9 Score  2 12 4 6   ?Difficult doing work/chores     Not difficult at all  ? ?  ? ?  08/28/2021  ?  9:10 AM 05/22/2021  ?  9:25 AM 01/15/2021  ?  8:37 AM 01/05/2020  ?  9:44 AM  ?GAD 7 : Generalized Anxiety Score  ?Nervous, Anxious, on Edge 1 2 0 1  ?Control/stop worrying 0 2 0 1  ?Worry too much - different things 0 2 1 1   ?Trouble relaxing 1 1 0 1  ?Restless 0 1 0 1  ?Easily annoyed or irritable 1 2 1 1   ?Afraid - awful might happen 0 0 0 0  ?Total GAD 7 Score 3 10 2 6   ?Anxiety Difficulty    Not difficult at all  ? ? ? ?Review of Systems:   ?Pertinent items are noted in HPI ?Denies abnormal vaginal discharge w/ itching/odor/irritation, headaches, visual changes, shortness of breath, chest pain, abdominal pain, severe nausea/vomiting, or problems with urination or bowel movements unless otherwise stated above. ?Pertinent  History Reviewed:  ?Reviewed past medical,surgical, social, obstetrical and family history.  ?Reviewed problem list, medications and allergies. ?Physical Assessment:  ? ?Vitals:  ? 10/23/21 1351  ?BP: 117/75  ?Pulse: 88  ?Weight: 194 lb (88 kg)  ?Body mass index is 33.3 kg/m?. ?     ?     Physical Examination:  ? General appearance: alert, well appearing, and in no distress ? Mental status: alert, oriented to person, place, and time ? Skin: warm & dry  ? Extremities: Edema: None  ?  Cardiovascular: normal heart rate noted ? Respiratory: normal respiratory effort, no distress ? Abdomen: gravid, soft, non-tender ? Pelvic: Cervical exam deferred        ? ?Fetal Status: Fetal Heart Rate (bpm): 127 u/s   Movement: Present   ? ?Fetal Surveillance Testing today:  [place] [id number] wks,cephalic,left lateral placenta gr 0,AFI 16.7 cm,FHR 127 bpm,RI .62,.58,.49,.49=50%,BPP 8/8 ? ?Chaperone: N/A   ? ?Results for orders placed or performed in visit on 10/23/21 (from the past 24 hour(s))  ?POC Urinalysis Dipstick OB  ?  Collection Time: 10/23/21  1:50 PM  ?Result Value Ref Range  ? Color, UA    ? Clarity, UA    ? Glucose, UA Negative Negative  ? Bilirubin, UA    ? Ketones, UA neg   ? Spec Grav, UA    ? Blood, UA neg   ? pH, UA    ? POC,PROTEIN,UA Negative Negative, Trace, Small (1+), Moderate (2+), Large (3+), 4+  ? Urobilinogen, UA    ? Nitrite, UA neg   ? Leukocytes, UA Negative Negative  ? Appearance    ? Odor    ?  ?Assessment & Plan:  ?High-risk pregnancy: G3P0111 at [redacted]w[redacted]d with an Estimated Date of Delivery: 11/29/21  ? ?1) CHTN, stable on norvasc 5mg  daily, ASA, reviewed pre-e s/s, reasons to seek care ? ?2) A1DM, stable ? ?3) H/O 36.5wk PPROM/PTB ? 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 ?Meds: No orders of the defined types were placed in this encounter. ? ? ?Labs/procedures today: U/S ? ?Treatment Plan:   Growth u/s q 4wks    2x/wk testing nst/sono @ 32wks     Deliver 38-39wks (37wks or prn if poor  control)____  ? ?Reviewed: Preterm labor symptoms and general obstetric precautions including but not limited to vaginal bleeding, contractions, leaking of fluid and fetal movement were reviewed in detail with the patient.  All questions were answered. Does have home bp cuff. Office bp cuff given: not applicable. Check bp weekly, let us know if consistently >140 and/or >90. ? ?Follow-up: Return for As scheduled. ? ? ?Future Appointments  ?Date Time Provider [place]  ?10/26/2021  9:10 AM [facility] NURSE [facility]  ?10/30/2021  8:30 AM [facility] - FTOBGYN [place] [facility]-FT[facility]G None  ?10/30/2021  9:30 AM Roma Schanz, CNM [facility]  ?11/06/2021  9:15 AM [facility] - FTO[facility]YN [place] [facility] None  ?11/06/2021 10:30 AM Roma Schanz, CNM [facility]  ?11/09/2021 10:30 AM [facility] NURSE [facility]  ?11/13/2021  9:45 AM [facility] - F[facility] [place] [facility] None  ?11/13/2021 10:30 AM Janyth Pupa, DO [facility]  ?11/16/2021  9:30 AM [facility] NURSE [facility]  ?11/20/2021  9:15 AM [facility] - FTO[facility] [place] [facility] None  ?11/20/2021 10:10 AM Roma Schanz, CNM [facility]  ?11/23/2021  9:10 AM [facility] NURSE [facility]  ?11/27/2021  8:30 AM [facility] - [facility] [place] [facility] None  ?11/27/2021  9:30 AM Roma Schanz, CNM [facility]  ? ? ?Orders Placed This Encounter  ?Procedures  ? POC Urinalysis Dipstick OB  ? ?[place], [place] ?10/23/2021 ?2:07 PM  ?

## 2021-10-23 NOTE — Progress Notes (Signed)
[place] 34+5 wks,cephalic,left lateral placenta gr 0,AFI 16.7 cm,FHR 127 bpm,RI .62,.58,.49,.49=50%,BPP 8/8 ?

## 2021-10-23 NOTE — Patient Instructions (Signed)
Meghan Phelps, thank you for choosing our office today! We appreciate the opportunity to meet your healthcare needs. You may receive a short survey by mail, e-mail, or through MyChart. If you are happy with your care we would appreciate if you could take just a few minutes to complete the survey questions. We read all of your comments and take your feedback very seriously. Thank you again for choosing our office.  ?[facility] ? ?[facility] ?([address] [place], [place] [address]) ?Entrance C, located off of [address] ?Free 24/7 valet parking  ? ?CLASSES: Go to [email or web address] to register for classes (childbirth, breastfeeding, waterbirth, infant CPR, daddy bootcamp, etc.) ? ?Call the office ([phone]) or go to [facility] if: ?You begin to have strong, frequent contractions ?Your water breaks.  Sometimes it is a big gush of fluid, sometimes it is just a trickle that keeps getting your panties wet or running down your legs ?You have vaginal bleeding.  It is normal to have a small amount of spotting if your cervix was checked.  ?You don't feel your baby moving like normal.  If you don't, get you something to eat and drink and lay down and focus on feeling your baby move.   If your baby is still not moving like normal, you should call the office or go to [facility]. ? ?Call the office ([phone]) or go to [facility] for these signs of pre-eclampsia: ?Severe headache that does not go away with Tylenol ?Visual changes- seeing spots, double, blurred vision ?Pain under your right breast or upper abdomen that does not go away with Tums or heartburn medicine ?Nausea and/or vomiting ?Severe swelling in your hands, feet, and face  ? ?Tdap Vaccine ?It is recommended that you get the Tdap vaccine during the third trimester of EACH pregnancy to help protect your baby from getting pertussis (whooping cough) ?27-36 weeks is the BEST time to do  this so that you can pass the protection on to your baby. During pregnancy is better than after pregnancy, but if you are unable to get it during pregnancy it will be offered at the hospital.  ?You can get this vaccine with us, at the health department, your family doctor, or some local pharmacies ?Everyone who will be around your baby should also be up-to-date on their vaccines before the baby comes. Adults (who are not pregnant) only need 1 dose of Tdap during adulthood.  ? ?[place] Pediatricians/Family Doctors ?[facility] ([facility]): [address], [phone]           ?[facility]: [address], [phone]                ?[facility] ([facility]): [address], [phone] (call to ask if accepting patients) ?[facility]: [address], [place], [phone]   ? ?[place] Pediatricians/Family Doctors ?[facility] ([facility]): [address], [phone] ?[facility]: [address], [phone] ?[facility]: [address], [phone] ? ?[place] Family Doctors  ?[facility] ([facility]): [phone] ?[facility]: [address], [phone]  ? ?[place] Family Doctors ?[facility]: [address], [phone] ? ?[place] Family Doctors  ?[facility]: [address], [phone] ? ?Home Blood Pressure Monitoring for Patients  ? ?Your provider has recommended that you check your   blood pressure (BP) at least once a week at home. If you do not have a blood pressure cuff at home, one will be provided for you. Contact your provider if you have not received your monitor within 1 week.  ? ?Helpful Tips for Accurate Home Blood Pressure Checks  ?Don't smoke, exercise, or drink caffeine 30 minutes before checking your BP ?Use the restroom before checking your BP (a full bladder can raise your  pressure) ?Relax in a comfortable upright chair ?Feet on the ground ?Left arm resting comfortably on a flat surface at the level of your heart ?Legs uncrossed ?Back supported ?Sit quietly and don't talk ?Place the cuff on your bare arm ?Adjust snuggly, so that only two fingertips can fit between your skin and the top of the cuff ?Check 2 readings separated by at least one minute ?Keep a log of your BP readings ?For a visual, please reference this diagram: http://ccnc.care/bpdiagram ? ?Provider Name: [facility]     Phone: [phone] ? ?Zone 1: ALL CLEAR  ?Continue to monitor your symptoms:  ?BP reading is less than 140 (top number) or less than 90 (bottom number)  ?No right upper stomach pain ?No headaches or seeing spots ?No feeling nauseated or throwing up ?No swelling in face and hands ? ?Zone 2: CAUTION ?Call your doctor's office for any of the following:  ?BP reading is greater than 140 (top number) or greater than 90 (bottom number)  ?Stomach pain under your ribs in the middle or right side ?Headaches or seeing spots ?Feeling nauseated or throwing up ?Swelling in face and hands ? ?Zone 3: EMERGENCY  ?Seek immediate medical care if you have any of the following:  ?BP reading is greater than160 (top number) or greater than 110 (bottom number) ?Severe headaches not improving with Tylenol ?Serious difficulty catching your breath ?Any worsening symptoms from Zone 2  ?Preterm Labor and Birth Information ? ?The normal length of a pregnancy is 39-41 weeks. Preterm labor is when labor starts before 37 completed weeks of pregnancy. ?What are the risk factors for preterm labor? ?Preterm labor is more likely to occur in women who: ?Have certain infections during pregnancy such as a bladder infection, sexually transmitted infection, or infection inside the uterus (chorioamnionitis). ?Have a shorter-than-normal cervix. ?Have gone into preterm labor before. ?Have had surgery on their cervix. ?Are younger than age 72  or older than age 108. ?Are African American. ?Are pregnant with twins or multiple babies (multiple gestation). ?Take street drugs or smoke while pregnant. ?Do not gain enough weight while pregnant. ?Became pregnant shortly after having been pregnant. ?What are the symptoms of preterm labor? ?Symptoms of preterm labor include: ?Cramps similar to those that can happen during a menstrual period. The cramps may happen with diarrhea. ?Pain in the abdomen or lower back. ?Regular uterine contractions that may feel like tightening of the abdomen. ?A feeling of increased pressure in the pelvis. ?Increased watery or bloody mucus discharge from the vagina. ?Water breaking (ruptured amniotic sac). ?Why is it important to recognize signs of preterm labor? ?It is important to recognize signs of preterm labor because babies who are born prematurely may not be fully developed. This can put them at an increased risk for: ?Long-term (chronic) heart and lung problems. ?Difficulty immediately after birth with regulating body systems, including blood sugar, body temperature, heart rate, and breathing rate. ?Bleeding in the brain. ?Cerebral palsy. ?Learning difficulties. ?Death. ?These risks are highest for babies who are born before 11 weeks  of pregnancy. ?How is preterm labor treated? ?Treatment depends on the length of your pregnancy, your condition, and the health of your baby. It may involve: ?Having a stitch (suture) placed in your cervix to prevent your cervix from opening too early (cerclage). ?Taking or being given medicines, such as: ?Hormone medicines. These may be given early in pregnancy to help support the pregnancy. ?Medicine to stop contractions. ?Medicines to help mature the baby?s lungs. These may be prescribed if the risk of delivery is high. ?Medicines to prevent your baby from developing cerebral palsy. ?If the labor happens before 34 weeks of pregnancy, you may need to stay in the hospital. ?What should I do if I  think I am in preterm labor? ?If you think that you are going into preterm labor, call your health care provider right away. ?How can I prevent preterm labor in future pregnancies? ?To increase your chance

## 2021-10-26 ENCOUNTER — Ambulatory Visit (INDEPENDENT_AMBULATORY_CARE_PROVIDER_SITE_OTHER): Payer: 59 | Admitting: *Deleted

## 2021-10-26 DIAGNOSIS — Z3A35 35 weeks gestation of pregnancy: Secondary | ICD-10-CM | POA: Diagnosis not present

## 2021-10-26 DIAGNOSIS — O0993 Supervision of high risk pregnancy, unspecified, third trimester: Secondary | ICD-10-CM | POA: Diagnosis not present

## 2021-10-26 DIAGNOSIS — I10 Essential (primary) hypertension: Secondary | ICD-10-CM

## 2021-10-26 DIAGNOSIS — O24410 Gestational diabetes mellitus in pregnancy, diet controlled: Secondary | ICD-10-CM

## 2021-10-26 NOTE — Progress Notes (Signed)
? ?  NURSE VISIT- NST ? ?SUBJECTIVE:  ?AMBIKA Phelps is a 31 y.o. [phone] female at [redacted]w[redacted]d, here for a NST for pregnancy complicated by Meghan Phelps and [id number].  She reports active fetal movement, contractions: none, vaginal bleeding: none, membranes: intact.  ? ?OBJECTIVE:  ?BP 118/81   Pulse 91   Wt 194 lb (88 kg)   LMP 02/22/2021 (Exact Date)   BMI 33.30 kg/m?   ?Appears well, no apparent distress ? ?No results found for this or any previous visit (from the past 24 hour(s)).  Pt unable to void. ? ?NST: FHR baseline 120 bpm, Variability: moderate, Accelerations:present, Decelerations:  Absent= Cat 1/reactive ?Toco: none  ? ?ASSESSMENT: ?[id number] at [redacted]w[redacted]d with CHTN and A1DM ?NST reactive ? ?PLAN: ?EFM strip reviewed by Knute Neu, CNM, [place]   ?Recommendations: keep next appointment as scheduled   ? ?Kristeen Miss Lianna Sitzmann  ?10/26/2021 ?11:07 AM ? ?

## 2021-10-29 ENCOUNTER — Ambulatory Visit: Payer: 59

## 2021-10-29 ENCOUNTER — Other Ambulatory Visit: Payer: Self-pay | Admitting: Women's Health

## 2021-10-29 DIAGNOSIS — O10919 Unspecified pre-existing hypertension complicating pregnancy, unspecified trimester: Secondary | ICD-10-CM

## 2021-10-29 DIAGNOSIS — O24410 Gestational diabetes mellitus in pregnancy, diet controlled: Secondary | ICD-10-CM

## 2021-10-30 ENCOUNTER — Encounter: Payer: Self-pay | Admitting: Women's Health

## 2021-10-30 ENCOUNTER — Ambulatory Visit (INDEPENDENT_AMBULATORY_CARE_PROVIDER_SITE_OTHER): Payer: 59

## 2021-10-30 ENCOUNTER — Ambulatory Visit (INDEPENDENT_AMBULATORY_CARE_PROVIDER_SITE_OTHER): Payer: 59 | Admitting: Women's Health

## 2021-10-30 VITALS — BP 117/77 | HR 90 | Wt 189.6 lb

## 2021-10-30 DIAGNOSIS — O24410 Gestational diabetes mellitus in pregnancy, diet controlled: Secondary | ICD-10-CM | POA: Diagnosis not present

## 2021-10-30 DIAGNOSIS — O0993 Supervision of high risk pregnancy, unspecified, third trimester: Secondary | ICD-10-CM

## 2021-10-30 DIAGNOSIS — I10 Essential (primary) hypertension: Secondary | ICD-10-CM

## 2021-10-30 DIAGNOSIS — O10919 Unspecified pre-existing hypertension complicating pregnancy, unspecified trimester: Secondary | ICD-10-CM | POA: Diagnosis not present

## 2021-10-30 LAB — POCT URINALYSIS DIPSTICK OB
Blood, UA: NEGATIVE
Glucose, UA: NEGATIVE
Ketones, UA: NEGATIVE
Leukocytes, UA: NEGATIVE
Nitrite, UA: NEGATIVE
POC,PROTEIN,UA: NEGATIVE

## 2021-10-30 NOTE — Patient Instructions (Signed)
Meghan Phelps, thank you for choosing our office today! We appreciate the opportunity to meet your healthcare needs. You may receive a short survey by mail, e-mail, or through [employer]. If you are happy with your care we would appreciate if you could take just a few minutes to complete the survey questions. We read all of your comments and take your feedback very seriously. Thank you again for choosing our office.  ?Center for [employer] Team at [facility] ? ?Women's & [place] at [facility] ?([address] [place], [place] [address]) ?Entrance C, located off of [address] ?Free 24/7 valet parking  ? ?CLASSES: Go to [email or web address] to register for classes (childbirth, breastfeeding, waterbirth, infant CPR, daddy bootcamp, etc.) ? ?Call the office [phone]) or go to [facility] if: ?You begin to have strong, frequent contractions ?Your water breaks.  Sometimes it is a big gush of fluid, sometimes it is just a trickle that keeps getting your panties wet or running down your legs ?You have vaginal bleeding.  It is normal to have a small amount of spotting if your cervix was checked.  ?You don't feel your baby moving like normal.  If you don't, get you something to eat and drink and lay down and focus on feeling your baby move.   If your baby is still not moving like normal, you should call the office or go to [facility]. ? ?Call the office [phone]) or go to [facility] hospital for these signs of pre-eclampsia: ?Severe headache that does not go away with Tylenol ?Visual changes- seeing spots, double, blurred vision ?Pain under your right breast or upper abdomen that does not go away with Tums or heartburn medicine ?Nausea and/or vomiting ?Severe swelling in your hands, feet, and face  ? ?Tdap Vaccine ?It is recommended that you get the Tdap vaccine during the third trimester of EACH pregnancy to help protect your baby from getting pertussis (whooping cough) ?27-36 weeks is the BEST time to do  this so that you can pass the protection on to your baby. During pregnancy is better than after pregnancy, but if you are unable to get it during pregnancy it will be offered at the hospital.  ?You can get this vaccine with [place], at the health department, your family doctor, or some local pharmacies ?Everyone who will be around your baby should also be up-to-date on their vaccines before the baby comes. Adults (who are not pregnant) only need 1 dose of Tdap during adulthood.  ? ?[place] Pediatricians/Family Doctors ?[facility]): [address] [place], [phone]           ?[facility]: [address], [phone]                ?[facility]): [place], [phone] (call to ask if accepting patients) ?[facility] Department: [address], [place], [place]   ? ?[place] Pediatricians/Family Doctors ?[facility]): [address], [phone] ?[place]: [address] [place], [phone] ?[facility] [place]: [name], [phone] ? ?[place]  ?[facility]): [phone] ?[facility]: [place], [phone]  ? ?[place] ?[place]: [place] [address], [phone] ? ?[place]  ?[facility]: [phone], [phone] ? ?Home Blood Pressure Monitoring for Patients  ? ?Your provider has recommended that you check your  blood pressure (BP) at least once a week at home. If you do not have a blood pressure cuff at home, one will be provided for you. Contact your provider if you have not received your monitor within 1 week.  ? ?Helpful Tips for Accurate Home Blood Pressure Checks  ?Don't smoke, exercise, or drink caffeine 30 minutes before checking your BP ?Use the restroom before checking your BP (a full bladder can raise your  pressure) ?Relax in a comfortable upright chair ?Feet on the ground ?Left arm resting comfortably on a flat surface at the level of your heart ?Legs uncrossed ?Back supported ?Sit quietly and don't talk ?Place the cuff on your bare arm ?Adjust snuggly, so that only two fingertips can fit between your skin and the top of the cuff ?Check 2 readings separated by at least one minute ?Keep a log of your BP readings ?For a visual, please reference this diagram: http://ccnc.care/bpdiagram ? ?Provider Name: [facility]     Phone: [phone] ? ?Zone 1: ALL CLEAR  ?Continue to monitor your symptoms:  ?BP reading is less than 140 (top number) or less than 90 (bottom number)  ?No right upper stomach pain ?No headaches or seeing spots ?No feeling nauseated or throwing up ?No swelling in face and hands ? ?Zone 2: CAUTION ?Call your doctor's office for any of the following:  ?BP reading is greater than 140 (top number) or greater than 90 (bottom number)  ?Stomach pain under your ribs in the middle or right side ?Headaches or seeing spots ?Feeling nauseated or throwing up ?Swelling in face and hands ? ?Zone 3: EMERGENCY  ?Seek immediate medical care if you have any of the following:  ?BP reading is greater than160 (top number) or greater than 110 (bottom number) ?Severe headaches not improving with Tylenol ?Serious difficulty catching your breath ?Any worsening symptoms from Zone 2  ?Preterm Labor and Birth Information ? ?The normal length of a pregnancy is 39-41 weeks. Preterm labor is when labor starts before 37 completed weeks of pregnancy. ?What are the risk factors for preterm labor? ?Preterm labor is more likely to occur in women who: ?Have certain infections during pregnancy such as a bladder infection, sexually transmitted infection, or infection inside the uterus (chorioamnionitis). ?Have a shorter-than-normal cervix. ?Have gone into preterm labor before. ?Have had surgery on their cervix. ?Are younger than age 72  or older than age 108. ?Are African American. ?Are pregnant with twins or multiple babies (multiple gestation). ?Take street drugs or smoke while pregnant. ?Do not gain enough weight while pregnant. ?Became pregnant shortly after having been pregnant. ?What are the symptoms of preterm labor? ?Symptoms of preterm labor include: ?Cramps similar to those that can happen during a menstrual period. The cramps may happen with diarrhea. ?Pain in the abdomen or lower back. ?Regular uterine contractions that may feel like tightening of the abdomen. ?A feeling of increased pressure in the pelvis. ?Increased watery or bloody mucus discharge from the vagina. ?Water breaking (ruptured amniotic sac). ?Why is it important to recognize signs of preterm labor? ?It is important to recognize signs of preterm labor because babies who are born prematurely may not be fully developed. This can put them at an increased risk for: ?Long-term (chronic) heart and lung problems. ?Difficulty immediately after birth with regulating body systems, including blood sugar, body temperature, heart rate, and breathing rate. ?Bleeding in the brain. ?Cerebral palsy. ?Learning difficulties. ?Death. ?These risks are highest for babies who are born before 11 weeks  of pregnancy. ?How is preterm labor treated? ?Treatment depends on the length of your pregnancy, your condition, and the health of your baby. It may involve: ?Having a stitch (suture) placed in your cervix to prevent your cervix from opening too early (cerclage). ?Taking or being given medicines, such as: ?Hormone medicines. These may be given early in pregnancy to help support the pregnancy. ?Medicine to stop contractions. ?Medicines to help mature the baby?s lungs. These may be prescribed if the risk of delivery is high. ?Medicines to prevent your baby from developing cerebral palsy. ?If the labor happens before 34 weeks of pregnancy, you may need to stay in the hospital. ?What should I do if I  think I am in preterm labor? ?If you think that you are going into preterm labor, call your health care provider right away. ?How can I prevent preterm labor in future pregnancies? ?To increase your chance

## 2021-10-30 NOTE — Progress Notes (Signed)
[place] 35+5 wks,cephalic,BPP 8/8,left lateral placenta gr 0,FHR 142 bpm,AFI 13.5 cm,RI .46,.59,.51=47% ?

## 2021-10-30 NOTE — Progress Notes (Signed)
? ?HIGH-RISK PREGNANCY VISIT ?Patient name: Meghan Phelps MRN [id number]  Date of birth: 07/05/1991 ?Chief Complaint:   ?Routine Prenatal Visit ? ?History of Present Illness:   ?Meghan Phelps is a 31 y.o. [phone] female at [redacted]w[redacted]d with an Estimated Date of Delivery: 11/29/21 being seen today for ongoing management of a high-risk pregnancy complicated by chronic hypertension currently on norvasc 5mg  daily  and diabetes mellitus A1DM.   ? ?Today she reports  all sugars wnl except one 2hr pp 124 . Contractions: Irregular. Vag. Bleeding: None.  Movement: Present. denies leaking of fluid.  ? ? ?  09/13/2021  ?  8:17 PM 08/28/2021  ?  9:10 AM 05/22/2021  ?  9:25 AM 01/15/2021  ?  8:37 AM 01/05/2020  ?  9:43 AM  ?Depression screen PHQ 2/9  ?Decreased Interest 0 0 2 1 1   ?Down, Depressed, Hopeless 0 0 1 0 1  ?PHQ - 2 Score 0 0 3 1 2   ?Altered sleeping  1 3 1 2   ?Tired, decreased energy  1 3 1 1   ?Change in appetite  0 3 1 0  ?Feeling bad or failure about yourself   0 0 0 1  ?Trouble concentrating  0 0 0 0  ?Moving slowly or fidgety/restless  0 0 0 0  ?Suicidal thoughts  0 0 0 0  ?PHQ-9 Score  2 12 4 6   ?Difficult doing work/chores     Not difficult at all  ? ?  ? ?  08/28/2021  ?  9:10 AM 05/22/2021  ?  9:25 AM 01/15/2021  ?  8:37 AM 01/05/2020  ?  9:44 AM  ?GAD 7 : Generalized Anxiety Score  ?Nervous, Anxious, on Edge 1 2 0 1  ?Control/stop worrying 0 2 0 1  ?Worry too much - different things 0 2 1 1   ?Trouble relaxing 1 1 0 1  ?Restless 0 1 0 1  ?Easily annoyed or irritable 1 2 1 1   ?Afraid - awful might happen 0 0 0 0  ?Total GAD 7 Score 3 10 2 6   ?Anxiety Difficulty    Not difficult at all  ? ? ? ?Review of Systems:   ?Pertinent items are noted in HPI ?Denies abnormal vaginal discharge w/ itching/odor/irritation, headaches, visual changes, shortness of breath, chest pain, abdominal pain, severe nausea/vomiting, or problems with urination or bowel movements unless otherwise stated above. ?Pertinent History Reviewed:  ?Reviewed past  medical,surgical, social, obstetrical and family history.  ?Reviewed problem list, medications and allergies. ?Physical Assessment:  ? ?Vitals:  ? 10/30/21 0907  ?BP: 117/77  ?Pulse: 90  ?Weight: 189 lb 9.6 oz (86 kg)  ?Body mass index is 32.54 kg/m?. ?     ?     Physical Examination:  ? General appearance: alert, well appearing, and in no distress ? Mental status: alert, oriented to person, place, and time ? Skin: warm & dry  ? Extremities: Edema: None  ?  Cardiovascular: normal heart rate noted ? Respiratory: normal respiratory effort, no distress ? Abdomen: gravid, soft, non-tender ? Pelvic: Cervical exam deferred        ? ?Fetal Status: Fetal Heart Rate (bpm): 142 u/s   Movement: Present   ? ?Fetal Surveillance Testing today: [place] 123XX123 wks,cephalic,BPP 123XX123 lateral placenta gr 0,FHR 142 bpm,AFI 13.5 cm,RI .46,.59,.51=47% ?  ? ?Chaperone: N/A   ? ?Results for orders placed or performed in visit on 10/30/21 (from the past 24 hour(s))  ?POC Urinalysis Dipstick OB  ?  Collection Time: 10/30/21  9:09 AM  ?Result Value Ref Range  ? Color, UA    ? Clarity, UA    ? Glucose, UA Negative Negative  ? Bilirubin, UA    ? Ketones, UA neg   ? Spec Grav, UA    ? Blood, UA neg   ? pH, UA    ? POC,PROTEIN,UA Negative Negative, Trace, Small (1+), Moderate (2+), Large (3+), 4+  ? Urobilinogen, UA    ? Nitrite, UA neg   ? Leukocytes, UA Negative Negative  ? Appearance    ? Odor    ?  ?Assessment & Plan:  ?High-risk pregnancy: G3P0111 at [redacted]w[redacted]d with an Estimated Date of Delivery: 11/29/21  ? ?1) CHTN, stable on norvasc 5mg , ASA ? ?2) A1DM, stable ? ?3) H/O 36.5wk PPROM/PTB ? ?Meds: No orders of the defined types were placed in this encounter. ? ? ?Labs/procedures today: U/S ? ?Treatment Plan:  Growth u/s q 4wks    2x/wk testing nst/sono   Deliver 38-39wks (37wks or prn if poor control)____  ? ?Reviewed: Preterm labor symptoms and general obstetric precautions including but not limited to vaginal bleeding, contractions, leaking of fluid  and fetal movement were reviewed in detail with the patient.  All questions were answered. Does have home bp cuff. Office bp cuff given: not applicable. Check bp weekly, let us know if consistently >140 and/or >90. ? ?Follow-up: Return for As scheduled. ? ? ?Future Appointments  ?Date Time Provider [place]  ?11/06/2021  9:15 AM [facility] [place] [facility] None  ?11/06/2021 10:30 AM Roma Schanz, CNM [facility]  ?11/09/2021 10:30 AM [facility] NURSE [facility]  ?11/13/2021  9:45 AM [facility] [place] [facility] None  ?11/13/2021 10:30 AM Janyth Pupa, DO [facility]  ?11/16/2021  9:30 AM [facility] NURSE [facility]  ?11/20/2021  9:15 AM [facility] [place] [facility] None  ?11/20/2021 10:10 AM Roma Schanz, CNM [facility]  ?11/23/2021  9:10 AM [facility] NURSE [facility]  ?11/27/2021  8:30 AM [facility] [place] [facility] None  ?11/27/2021  9:30 AM Roma Schanz, CNM [facility]  ? ? ?Orders Placed This Encounter  ?Procedures  ? POC Urinalysis Dipstick OB  ? ?[place], [place] ?10/30/2021 ?9:44 AM  ?

## 2021-11-05 ENCOUNTER — Other Ambulatory Visit: Payer: Self-pay | Admitting: Women's Health

## 2021-11-05 DIAGNOSIS — O10919 Unspecified pre-existing hypertension complicating pregnancy, unspecified trimester: Secondary | ICD-10-CM

## 2021-11-05 DIAGNOSIS — O24410 Gestational diabetes mellitus in pregnancy, diet controlled: Secondary | ICD-10-CM

## 2021-11-06 ENCOUNTER — Ambulatory Visit (INDEPENDENT_AMBULATORY_CARE_PROVIDER_SITE_OTHER): Payer: 59 | Admitting: Women's Health

## 2021-11-06 ENCOUNTER — Ambulatory Visit (INDEPENDENT_AMBULATORY_CARE_PROVIDER_SITE_OTHER): Payer: 59

## 2021-11-06 ENCOUNTER — Other Ambulatory Visit (HOSPITAL_COMMUNITY)
Admission: RE | Admit: 2021-11-06 | Discharge: 2021-11-06 | Disposition: A | Discharge status: Home / Self Care | Payer: 59 | Source: Ambulatory Visit | Attending: Women's Health | Admitting: Women's Health

## 2021-11-06 ENCOUNTER — Encounter: Payer: Self-pay | Admitting: Women's Health

## 2021-11-06 VITALS — BP 116/75 | Wt 188.8 lb

## 2021-11-06 DIAGNOSIS — O24410 Gestational diabetes mellitus in pregnancy, diet controlled: Secondary | ICD-10-CM | POA: Diagnosis not present

## 2021-11-06 DIAGNOSIS — Z3A36 36 weeks gestation of pregnancy: Secondary | ICD-10-CM | POA: Diagnosis not present

## 2021-11-06 DIAGNOSIS — O0993 Supervision of high risk pregnancy, unspecified, third trimester: Secondary | ICD-10-CM

## 2021-11-06 DIAGNOSIS — I10 Essential (primary) hypertension: Secondary | ICD-10-CM

## 2021-11-06 DIAGNOSIS — O10919 Unspecified pre-existing hypertension complicating pregnancy, unspecified trimester: Secondary | ICD-10-CM | POA: Diagnosis not present

## 2021-11-06 LAB — POCT URINALYSIS DIPSTICK OB
Blood, UA: NEGATIVE
Glucose, UA: NEGATIVE
Ketones, UA: NEGATIVE
Nitrite, UA: NEGATIVE
POC,PROTEIN,UA: NEGATIVE

## 2021-11-06 NOTE — Progress Notes (Signed)
[place] 36+5 wks,cephalic,BPP 8/8,left lateral placenta gr 1,AFI 142 bpm,RI .45,.50,.42,.46=15%,EFW 3387 g 86% ?

## 2021-11-06 NOTE — Progress Notes (Signed)
? ?HIGH-RISK PREGNANCY VISIT ?Patient name: Meghan Phelps MRN [id number]  Date of birth: November 25, 1990 ?Chief Complaint:   ?High Risk Gestation (Ultrasound/ culture today) ? ?History of Present Illness:   ?Meghan Phelps is a 31 y.o. [phone] female at [redacted]w[redacted]d with an Estimated Date of Delivery: 11/29/21 being seen today for ongoing management of a high-risk pregnancy complicated by chronic hypertension currently on norvasc 5mg  and diabetes mellitus A1DM.   ? ?Today she reports  all fbs <95, only 2 of her 2hr pp >120 (highest 130) . Contractions: Irregular.  .  Movement: Present. denies leaking of fluid.  ? ? ?  09/13/2021  ?  8:17 PM 08/28/2021  ?  9:10 AM 05/22/2021  ?  9:25 AM 01/15/2021  ?  8:37 AM 01/05/2020  ?  9:43 AM  ?Depression screen PHQ 2/9  ?Decreased Interest 0 0 2 1 1   ?Down, Depressed, Hopeless 0 0 1 0 1  ?PHQ - 2 Score 0 0 3 1 2   ?Altered sleeping  1 3 1 2   ?Tired, decreased energy  1 3 1 1   ?Change in appetite  0 3 1 0  ?Feeling bad or failure about yourself   0 0 0 1  ?Trouble concentrating  0 0 0 0  ?Moving slowly or fidgety/restless  0 0 0 0  ?Suicidal thoughts  0 0 0 0  ?PHQ-9 Score  2 12 4 6   ?Difficult doing work/chores     Not difficult at all  ? ?  ? ?  08/28/2021  ?  9:10 AM 05/22/2021  ?  9:25 AM 01/15/2021  ?  8:37 AM 01/05/2020  ?  9:44 AM  ?GAD 7 : Generalized Anxiety Score  ?Nervous, Anxious, on Edge 1 2 0 1  ?Control/stop worrying 0 2 0 1  ?Worry too much - different things 0 2 1 1   ?Trouble relaxing 1 1 0 1  ?Restless 0 1 0 1  ?Easily annoyed or irritable 1 2 1 1   ?Afraid - awful might happen 0 0 0 0  ?Total GAD 7 Score 3 10 2 6   ?Anxiety Difficulty    Not difficult at all  ? ? ? ?Review of Systems:   ?Pertinent items are noted in HPI ?Denies abnormal vaginal discharge w/ itching/odor/irritation, headaches, visual changes, shortness of breath, chest pain, abdominal pain, severe nausea/vomiting, or problems with urination or bowel movements unless otherwise stated above. ?Pertinent History Reviewed:   ?Reviewed past medical,surgical, social, obstetrical and family history.  ?Reviewed problem list, medications and allergies. ?Physical Assessment:  ? ?Vitals:  ? 11/06/21 1004  ?BP: 116/75  ?Weight: 188 lb 12.8 oz (85.6 kg)  ?Body mass index is 32.41 kg/m?. ?     ?     Physical Examination:  ? General appearance: alert, well appearing, and in no distress ? Mental status: alert, oriented to person, place, and time ? Skin: warm & dry  ? Extremities: Edema: None  ?  Cardiovascular: normal heart rate noted ? Respiratory: normal respiratory effort, no distress ? Abdomen: gravid, soft, non-tender ? Pelvic: Cervical exam deferred  Dilation: 3 Effacement (%): Thick Station: -2 ? ?Fetal Status: Fetal Heart Rate (bpm): 142 u/s   Movement: Present Presentation: Vertex ? ?Fetal Surveillance Testing today: 36+5 wks,cephalic,BPP 8/8,left lateral placenta gr 1,AFI 142 bpm,RI .45,.50,.42,.46=15%,EFW 3387 g 86% ? ?Chaperone: 08/30/2021   ? ?Results for orders placed or performed in visit on 11/06/21 (from the past 24 hour(s))  ?POC Urinalysis Dipstick OB  ?  Collection Time: 11/06/21 10:11 AM  ?Result Value Ref Range  ? Color, UA    ? Clarity, UA    ? Glucose, UA Negative Negative  ? Bilirubin, UA    ? Ketones, UA neg   ? Spec Grav, UA    ? Blood, UA neg   ? pH, UA    ? POC,PROTEIN,UA Negative Negative, Trace, Small (1+), Moderate (2+), Large (3+), 4+  ? Urobilinogen, UA    ? Nitrite, UA neg   ? Leukocytes, UA Trace (A) Negative  ? Appearance    ? Odor    ?  ?Assessment & Plan:  ?High-risk pregnancy: G3P0111 at [redacted]w[redacted]d with an Estimated Date of Delivery: 11/29/21  ? ?1) CHTN, stable on norvasc 5mg , ASA, IOL scheduled for 4/27 AM,  IOL form faxed and orders placed  ? ?2) A1DM, stable, EFW today 86%/3387g ? ?3) H/O 36.5wk PPROM/PTB ? 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 ?Meds: No orders of the defined types were placed in this encounter. ? ? ?Labs/procedures today: GBS, GC/CT, SVE, and  U/S ? ?Treatment Plan:  2x/wk testing nst/sono  Deliver 38-39wks (37wks or prn if poor control)____  ? ?Reviewed: Preterm labor symptoms and general obstetric precautions including but not limited to vaginal bleeding, contractions, leaking of fluid and fetal movement were reviewed in detail with the patient.  All questions were answered. Does have home bp cuff. Office bp cuff given: not applicable. Check bp weekly, let 5/27 know if consistently >140 and/or >90. ? ?Follow-up: Return for As scheduled. ? ? ?Future Appointments  ?Date Time Provider Department Center  ?11/09/2021 10:30 AM [facility] NURSE [facility]  ?11/13/2021  9:45 AM [facility] 11/15/2021 [facility] None  ?11/13/2021 10:30 AM 11/15/2021, DO [facility]  ?11/16/2021  9:30 AM [facility] NURSE [facility]  ?11/20/2021  9:15 AM [facility] 11/22/2021 [facility] None  ?11/20/2021 10:10 AM 11/22/2021, CNM [facility]  ?11/23/2021  9:10 AM [facility] NURSE [facility]  ?11/27/2021  8:30 AM [facility] 01/27/2022 [facility] None  ?11/27/2021  9:30 AM 01/27/2022, CNM [facility]  ? ? ?Orders Placed This Encounter  ?Procedures  ? Strep Gp B NAA  ? POC Urinalysis Dipstick OB  ? ?Cheral Marker CNM, Cheral Marker ?11/06/2021 ?12:15 PM  ?

## 2021-11-06 NOTE — Patient Instructions (Signed)
Meghan Phelps, thank you for choosing our office today! We appreciate the opportunity to meet your healthcare needs. You may receive a short survey by mail, e-mail, or through [employer]. If you are happy with your care we would appreciate if you could take just a few minutes to complete the survey questions. We read all of your comments and take your feedback very seriously. Thank you again for choosing our office.  ?Center for [employer] Team at [facility] ? ?Women's [facility] Children[facility] Center at [facility] ?([address] [place], [place] [address]) ?Entrance C, located off of [address] ?Free 24/7 valet parking  ? ?CLASSES: Go to [email or web address] to register for classes (childbirth, breastfeeding, waterbirth, infant CPR, daddy bootcamp, etc.) ? ?Call the office [phone]) or go to [facility] if: ?You begin to have strong, frequent contractions ?Your water breaks.  Sometimes it is a big gush of fluid, sometimes it is just a trickle that keeps getting your panties wet or running down your legs ?You have vaginal bleeding.  It is normal to have a small amount of spotting if your cervix was checked.  ?You don't feel your baby moving like normal.  If you don't, get you something to eat and drink and lay down and focus on feeling your baby move.   If your baby is still not moving like normal, you should call the office or go to [facility]. ? ?Call the office [phone]) or go to [facility] hospital for these signs of pre-eclampsia: ?Severe headache that does not go away with Tylenol ?Visual changes- seeing spots, double, blurred vision ?Pain under your right breast or upper abdomen that does not go away with Tums or heartburn medicine ?Nausea and/or vomiting ?Severe swelling in your hands, feet, and face  ? ?[place] Pediatricians/Family Doctors ?[facility]): [address], [phone]           ?[facility]: [address], [phone]                 ?[facility]): [address], [phone] (call to ask if accepting patients) ?[facility]: [address], [place], [phone]   ? ?[place] Pediatricians/Family Doctors ?[facility]): [address], [phone] ?[facility]: [address] [place], [phone] ?[facility]: [address], [phone] ? ?[employer] Family Doctors  ?[facility]): [phone] ?[facility]: [address], [phone]  ? ?[facility] Family Doctors ?[facility]: [address], [phone] ? ?[facility]  ?[facility]: [phone], [phone] ? ?Home Blood Pressure Monitoring for Patients  ? ?Your provider has recommended that you check your blood pressure (BP) at least once a week at home. If you do not have a blood pressure cuff at home, one will be provided for you. Contact your provider if you have not received your monitor within 1 week.  ? ?Helpful Tips for Accurate Home Blood Pressure Checks  ?Don't smoke, exercise, or drink caffeine 30 minutes before checking your BP ?Use the restroom before checking your BP (a full bladder can raise your pressure) ?Relax in a comfortable upright chair ?Feet on the ground ?Left arm resting comfortably on a flat surface at the level of your heart ?Legs uncrossed ?Back supported ?Sit quietly and don't talk ?Place the cuff on your bare arm ?Adjust snuggly, so that only two fingertips  can fit between your skin and the top of the cuff ?Check 2 readings separated by at least one minute ?Keep a log of your BP readings ?For a visual, please reference this diagram: http://ccnc.care/bpdiagram ? ?Provider Name: [facility]     Phone: [phone] ? ?Zone 1: ALL CLEAR  ?Continue to monitor your symptoms:  ?BP reading is less than 140 (top number) or less than 90 (bottom number)  ?No right  upper stomach pain ?No headaches or seeing spots ?No feeling nauseated or throwing up ?No swelling in face and hands ? ?Zone 2: CAUTION ?Call your doctor's office for any of the following:  ?BP reading is greater than 140 (top number) or greater than 90 (bottom number)  ?Stomach pain under your ribs in the middle or right side ?Headaches or seeing spots ?Feeling nauseated or throwing up ?Swelling in face and hands ? ?Zone 3: EMERGENCY  ?Seek immediate medical care if you have any of the following:  ?BP reading is greater than160 (top number) or greater than 110 (bottom number) ?Severe headaches not improving with Tylenol ?Serious difficulty catching your breath ?Any worsening symptoms from Zone 2  ? ?Braxton Hicks Contractions ?Contractions of the uterus can occur throughout pregnancy, but they are not always a sign that you are in labor. You may have practice contractions called Braxton Hicks contractions. These false labor contractions are sometimes confused with true labor. ?What are [place] contractions? ?Braxton Hicks contractions are tightening movements that occur in the muscles of the uterus before labor. Unlike true labor contractions, these contractions do not result in opening (dilation) and thinning of the cervix. Toward the end of pregnancy (32-34 weeks), Braxton Hicks contractions can happen more often and may become stronger. These contractions are sometimes difficult to tell apart from true labor because they can be very uncomfortable. You should not feel embarrassed if you go to the hospital with false labor. ?Sometimes, the only way to tell if you are in true labor is for your health care provider to look for changes in the cervix. The health care provider will do a physical exam and may monitor your contractions. If you are not in true labor, the exam should show that your cervix is not dilating and your water has not broken. ?If there are no other health problems associated with your  pregnancy, it is completely safe for you to be sent home with false labor. You may continue to have Braxton Hicks contractions until you go into true labor. ?How to tell the difference between true labor and false labor ?True labor ?Contractions last 30-70 seconds. ?Contractions become very regular. ?Discomfort is usually felt in the top of the uterus, and it spreads to the lower abdomen and low back. ?Contractions do not go away with walking. ?Contractions usually become more intense and increase in frequency. ?The cervix dilates and gets thinner. ?False labor ?Contractions are usually shorter and not as strong as true labor contractions. ?Contractions are usually irregular. ?Contractions are often felt in the front of the lower abdomen and in the groin. ?Contractions may go away when you walk around or change positions while lying down. ?Contractions get weaker and are shorter-lasting as time goes on. ?The cervix usually does not dilate or become thin. ?Follow these instructions at home: ? ?Take over-the-counter and prescription medicines only as told by your health care provider. ?Keep up with your usual exercises and follow other instructions from your health care provider. ?Eat and drink lightly if you think  you are going into labor. ?If Braxton Hicks contractions are making you uncomfortable: ?Change your position from lying down or resting to walking, or change from walking to resting. ?Sit and rest in a tub of warm water. ?Drink enough fluid to keep your urine pale yellow. Dehydration may cause these contractions. ?Do slow and deep breathing several times an hour. ?Keep all follow-up prenatal visits as told by your health care provider. This is important. ?Contact a health care provider if: ?You have a fever. ?You have continuous pain in your abdomen. ?Get help right away if: ?Your contractions become stronger, more regular, and closer together. ?You have fluid leaking or gushing from your vagina. ?You pass  blood-tinged mucus (bloody show). ?You have bleeding from your vagina. ?You have low back pain that you never had before. ?You feel your baby?s head pushing down and causing pelvic pressure. ?Your baby is not m

## 2021-11-07 LAB — CERVICOVAGINAL ANCILLARY ONLY
Chlamydia: NEGATIVE
Comment: NEGATIVE
Comment: NORMAL
Neisseria Gonorrhea: NEGATIVE

## 2021-11-08 ENCOUNTER — Telehealth (HOSPITAL_COMMUNITY): Payer: Self-pay | Admitting: *Deleted

## 2021-11-08 LAB — STREP GP B NAA: Strep Gp B NAA: NEGATIVE

## 2021-11-08 NOTE — Telephone Encounter (Signed)
Preadmission screen  

## 2021-11-09 ENCOUNTER — Ambulatory Visit (INDEPENDENT_AMBULATORY_CARE_PROVIDER_SITE_OTHER): Payer: 59 | Admitting: *Deleted

## 2021-11-09 VITALS — BP 115/71 | HR 82 | Wt 192.4 lb

## 2021-11-09 DIAGNOSIS — Z331 Pregnant state, incidental: Secondary | ICD-10-CM

## 2021-11-09 DIAGNOSIS — O24410 Gestational diabetes mellitus in pregnancy, diet controlled: Secondary | ICD-10-CM | POA: Diagnosis not present

## 2021-11-09 DIAGNOSIS — O0993 Supervision of high risk pregnancy, unspecified, third trimester: Secondary | ICD-10-CM | POA: Diagnosis not present

## 2021-11-09 DIAGNOSIS — Z1389 Encounter for screening for other disorder: Secondary | ICD-10-CM

## 2021-11-09 DIAGNOSIS — Z3A37 37 weeks gestation of pregnancy: Secondary | ICD-10-CM | POA: Diagnosis not present

## 2021-11-09 DIAGNOSIS — O288 Other abnormal findings on antenatal screening of mother: Secondary | ICD-10-CM

## 2021-11-09 DIAGNOSIS — I10 Essential (primary) hypertension: Secondary | ICD-10-CM

## 2021-11-09 LAB — POCT URINALYSIS DIPSTICK OB
Blood, UA: NEGATIVE
Glucose, UA: NEGATIVE
Ketones, UA: NEGATIVE
Leukocytes, UA: NEGATIVE
Nitrite, UA: NEGATIVE
POC,PROTEIN,UA: NEGATIVE

## 2021-11-09 NOTE — Progress Notes (Signed)
? ?  NURSE VISIT- NST ? ?SUBJECTIVE:  ?Meghan Phelps is a 31 y.o. [phone] female at [redacted]w[redacted]d, here for a NST for pregnancy complicated by [facility] and A1DM.  She reports active fetal movement, contractions: occasional, vaginal bleeding: none, membranes: intact.  ? ?OBJECTIVE:  ?BP 115/71   Pulse 82   Wt 192 lb 6.4 oz (87.3 kg)   LMP 02/22/2021 (Exact Date)   BMI 33.03 kg/m?   ?Appears well, no apparent distress ? ?Results for orders placed or performed in visit on 11/09/21 (from the past 24 hour(s))  ?POC Urinalysis Dipstick OB  ? Collection Time: 11/09/21 10:52 AM  ?Result Value Ref Range  ? Color, UA    ? Clarity, UA    ? Glucose, UA Negative Negative  ? Bilirubin, UA    ? Ketones, UA neg   ? Spec Grav, UA    ? Blood, UA neg   ? pH, UA    ? POC,PROTEIN,UA Negative Negative, Trace, Small (1+), Moderate (2+), Large (3+), 4+  ? Urobilinogen, UA    ? Nitrite, UA neg   ? Leukocytes, UA Negative Negative  ? Appearance    ? Odor    ? ? ?NST: FHR baseline 140 bpm, Variability: moderate, Accelerations:present, Decelerations:  Absent= Cat 1/reactive ?Toco: occasional  ? ?ASSESSMENT: ?[id number] at [redacted]w[redacted]d with CHTN and A1DM ?NST reactive ? ?PLAN: ?EFM strip reviewed by Dr. Despina Hidden   ?Recommendations: keep next appointment as scheduled   ? ?Debbe Odea Tisha Cline  ?11/09/2021 ?11:24 AM ? ?

## 2021-11-12 ENCOUNTER — Other Ambulatory Visit: Payer: Self-pay | Admitting: Women's Health

## 2021-11-12 ENCOUNTER — Encounter (HOSPITAL_COMMUNITY): Payer: Self-pay | Admitting: *Deleted

## 2021-11-12 ENCOUNTER — Telehealth (HOSPITAL_COMMUNITY): Payer: Self-pay | Admitting: *Deleted

## 2021-11-12 DIAGNOSIS — O10919 Unspecified pre-existing hypertension complicating pregnancy, unspecified trimester: Secondary | ICD-10-CM

## 2021-11-12 DIAGNOSIS — O24410 Gestational diabetes mellitus in pregnancy, diet controlled: Secondary | ICD-10-CM

## 2021-11-12 NOTE — Telephone Encounter (Signed)
Preadmission screen  

## 2021-11-13 ENCOUNTER — Encounter: Payer: Self-pay | Admitting: Obstetrics & Gynecology

## 2021-11-13 ENCOUNTER — Telehealth (HOSPITAL_COMMUNITY): Payer: Self-pay | Admitting: *Deleted

## 2021-11-13 ENCOUNTER — Other Ambulatory Visit: Payer: Self-pay | Admitting: Women's Health

## 2021-11-13 ENCOUNTER — Ambulatory Visit (INDEPENDENT_AMBULATORY_CARE_PROVIDER_SITE_OTHER): Payer: 59 | Admitting: Obstetrics & Gynecology

## 2021-11-13 ENCOUNTER — Ambulatory Visit (INDEPENDENT_AMBULATORY_CARE_PROVIDER_SITE_OTHER): Payer: 59

## 2021-11-13 VITALS — BP 120/80 | HR 74 | Wt 191.2 lb

## 2021-11-13 DIAGNOSIS — O0993 Supervision of high risk pregnancy, unspecified, third trimester: Secondary | ICD-10-CM

## 2021-11-13 DIAGNOSIS — O10919 Unspecified pre-existing hypertension complicating pregnancy, unspecified trimester: Secondary | ICD-10-CM | POA: Diagnosis not present

## 2021-11-13 DIAGNOSIS — O24410 Gestational diabetes mellitus in pregnancy, diet controlled: Secondary | ICD-10-CM

## 2021-11-13 DIAGNOSIS — Z3A37 37 weeks gestation of pregnancy: Secondary | ICD-10-CM

## 2021-11-13 DIAGNOSIS — I10 Essential (primary) hypertension: Secondary | ICD-10-CM | POA: Diagnosis not present

## 2021-11-13 LAB — POCT URINALYSIS DIPSTICK OB
Blood, UA: NEGATIVE
Glucose, UA: NEGATIVE
Ketones, UA: NEGATIVE
Leukocytes, UA: NEGATIVE
Nitrite, UA: NEGATIVE
POC,PROTEIN,UA: NEGATIVE

## 2021-11-13 NOTE — Progress Notes (Signed)
[place] 37+5 wks,cephalic,BPP 6/8 no breathing,FHR 127 bpm,RI .52,.48,.49=23%,AFI 15.8 cm,left lateral placenta gr 2,discussed result with Dr Charlotta Newton ?

## 2021-11-13 NOTE — Telephone Encounter (Signed)
Preadmission screen  

## 2021-11-13 NOTE — Progress Notes (Signed)
? ?HIGH-RISK PREGNANCY VISIT ?Patient name: JEHIELI Phelps MRN [id number]  Date of birth: Nov 18, 1990 ?Chief Complaint:   ?High Risk Gestation ([place] & NST today) ? ?History of Present Illness:   ?LEEBA Phelps is a 31 y.o. [phone] female at 6w5dwith an Estimated Date of Delivery: 11/29/21 being seen today for ongoing management of a high-risk pregnancy complicated by: ?Chronic HTN- on Norvasc daily ?GDMA1- well controlled ?   ? ?Today she reports no complaints.  ? ?Contractions: Irritability. Vag. Bleeding: None.  Movement: Present. denies leaking of fluid.  ? ? ?  09/13/2021  ?  8:17 PM 08/28/2021  ?  9:10 AM 05/22/2021  ?  9:25 AM 01/15/2021  ?  8:37 AM 01/05/2020  ?  9:43 AM  ?Depression screen PHQ 2/9  ?Decreased Interest 0 0 _0 ?Down, Depressed, Hopeless 0 0 1 0 1  ?PHQ - 2 Score 0 0 _1 ?Altered sleeping  _2 ?Tired, decreased energy  _3 ?Change in appetite  0 3 1 0  ?Feeling bad or failure about yourself   0 0 0 1  ?Trouble concentrating  0 0 0 0  ?Moving slowly or fidgety/restless  0 0 0 0  ?Suicidal thoughts  0 0 0 0  ?PHQ-9 Score  _4 ?Difficult doing work/chores     Not difficult at all  ? ? ? ?Current Outpatient Medications  ?Medication Instructions  ? acyclovir (ZOVIRAX) 400 mg, Oral, 3 times daily  ? amLODipine (NORVASC) 5 mg, Oral, Daily  ? aspirin 162 mg, Oral, Daily, Swallow whole.  ? Blood Glucose Monitoring Suppl (ONETOUCH VERIO) w/Device KIT Use as directed to check blood sugar 4 times daily  ? glucose blood test strip Use as instructed  ? Lancets (ONETOUCH ULTRASOFT) lancets Use as directed to check blood sugar 4 times daily  ? prenatal vitamin w/FE, FA (PRENATAL 1 + 1) 27-1 MG TABS tablet 1 tablet, Oral, Daily  ? promethazine (PHENERGAN) 25 MG tablet TAKE 1 TABLET BY MOUTH EVERY 6 HOURS AS NEEDED FOR NAUSEA OR VOMITING  ?  ? ?Review of Systems:   ?Pertinent items are noted in HPI ?Denies abnormal vaginal discharge w/ itching/odor/irritation, headaches, visual changes, shortness  of breath, chest pain, abdominal pain, severe nausea/vomiting, or problems with urination or bowel movements unless otherwise stated above. ?Pertinent History Reviewed:  ?Reviewed past medical,surgical, social, obstetrical and family history.  ?Reviewed problem list, medications and allergies. ?Physical Assessment:  ? ?Vitals:  ? 11/13/21 1024  ?BP: 120/80  ?Pulse: 74  ?Weight: 191 lb 3.2 oz (86.7 kg)  ?Body mass index is 32.82 kg/m?. ?     ?     Physical Examination:  ? General appearance: alert, well appearing, and in no distress ? Mental status: normal mood, behavior, speech, dress, motor activity, and thought processes ? Skin: warm & dry  ? Extremities: Edema: None  ?  Cardiovascular: normal heart rate noted ? Respiratory: normal respiratory effort, no distress ? Abdomen: gravid, soft, non-tender ? Pelvic: Cervical exam deferred        ? ?Fetal Status:     Movement: Present   ? ?Fetal Surveillance Testing today: NST and BPP ? ?cephalic,BPP 6/8 no breathing,FHR 127 bpm,RI .52,.48,.49=23%,AFI 15.8 cm,left lateral placenta gr 2 ? ?NST being performed due to BPP 6/8, cHTN, GDM ? ?Fetal Monitoring:  Baseline: 120 bpm, Variability: moderate, Accelerations: present, The accelerations are >15 bpm and  more than 2 in 20 minutes, and Decelerations: Absent   ? ? ?Final diagnosis:   Reactive NST ? ?  ? ?Chaperone: N/A   ? ?Results for orders placed or performed in visit on 11/13/21 (from the past 24 hour(s))  ?POC Urinalysis Dipstick OB  ? Collection Time: 11/13/21 10:25 AM  ?Result Value Ref Range  ? Color, UA    ? Clarity, UA    ? Glucose, UA Negative Negative  ? Bilirubin, UA    ? Ketones, UA neg   ? Spec Grav, UA    ? Blood, UA neg   ? pH, UA    ? POC,PROTEIN,UA Negative Negative, Trace, Small (1+), Moderate (2+), Large (3+), 4+  ? Urobilinogen, UA    ? Nitrite, UA neg   ? Leukocytes, UA Negative Negative  ? Appearance    ? Odor    ?  ? ?Assessment & Plan:  ?High-risk pregnancy: G3P0111 at 33w5dwith an Estimated Date of  Delivery: 11/29/21  ? ?1) cHTN- continue norvasc ?-well controlled ?BPP with NST 8/10 ?-scheduled for IOL on Thursday ? ?2) GDM ?-well controlled ? ?Meds: No orders of the defined types were placed in this encounter. ? ? ?Labs/procedures today: NST, BPP ? ?Treatment Plan:  plan for IOL later this week ? ?Reviewed: Term labor symptoms and general obstetric precautions including but not limited to vaginal bleeding, contractions, leaking of fluid and fetal movement were reviewed in detail with the patient.  All questions were answered. Pt has home bp cuff. Check bp weekly, let uKoreaknow if >140/90.  ? ?Follow-up: Return for being induced on Thursday.  Please schedule 1wk from Friday BP check and then 5wk PPV. ? ? ?Future Appointments  ?Date Time Provider [place] ?11/15/2021 12:00 AM [facility] SCHED ROOM [facility]-INDC None  ?11/16/2021  9:30 AM [facility]-FTOBGYN NURSE [facility] FTOBGYN  ?11/20/2021  9:15 AM [facility] - FTOBGYN [place]-FTIMG None  ?11/20/2021 10:10 AM BRoma Schanz CNM [facility] FTOBGYN  ?11/22/2021  6:30 AM [facility]-LD SCHED ROOM [facility]-INDC None  ?11/23/2021  9:10 AM [facility] NURSE [facility]TO[facility]  ?11/27/2021  8:30 AM [facility] - FTOBGYN [place]WH-FTIMG None  ?11/27/2021  9:30 AM BRoma Schanz CNM [facility] FTOBGYN  ? ? ?Orders Placed This Encounter  ?Procedures  ? POC Urinalysis Dipstick OB  ? ? ?JJanyth Pupa DO ?[facility]ending [facility] ?[facility] for [place]? ? ? ?

## 2021-11-15 ENCOUNTER — Other Ambulatory Visit: Payer: Self-pay

## 2021-11-15 ENCOUNTER — Inpatient Hospital Stay (HOSPITAL_COMMUNITY)
Admission: AD | Admit: 2021-11-15 | Discharge: 2021-11-16 | DRG: 806 | Disposition: A | Discharge status: Home / Self Care | Payer: 59 | Attending: Obstetrics and Gynecology | Admitting: Obstetrics and Gynecology

## 2021-11-15 ENCOUNTER — Encounter (HOSPITAL_COMMUNITY): Payer: Self-pay | Admitting: Obstetrics & Gynecology

## 2021-11-15 ENCOUNTER — Inpatient Hospital Stay (HOSPITAL_COMMUNITY): Payer: 59 | Admitting: Anesthesiology

## 2021-11-15 ENCOUNTER — Inpatient Hospital Stay (HOSPITAL_COMMUNITY): Payer: 59

## 2021-11-15 DIAGNOSIS — O09899 Supervision of other high risk pregnancies, unspecified trimester: Secondary | ICD-10-CM

## 2021-11-15 DIAGNOSIS — A6000 Herpesviral infection of urogenital system, unspecified: Secondary | ICD-10-CM | POA: Diagnosis not present

## 2021-11-15 DIAGNOSIS — Z87891 Personal history of nicotine dependence: Secondary | ICD-10-CM

## 2021-11-15 DIAGNOSIS — Z2839 Other underimmunization status: Secondary | ICD-10-CM

## 2021-11-15 DIAGNOSIS — O24429 Gestational diabetes mellitus in childbirth, unspecified control: Secondary | ICD-10-CM | POA: Diagnosis not present

## 2021-11-15 DIAGNOSIS — Z3A38 38 weeks gestation of pregnancy: Secondary | ICD-10-CM

## 2021-11-15 DIAGNOSIS — O1002 Pre-existing essential hypertension complicating childbirth: Principal | ICD-10-CM | POA: Diagnosis present

## 2021-11-15 DIAGNOSIS — O9832 Other infections with a predominantly sexual mode of transmission complicating childbirth: Secondary | ICD-10-CM | POA: Diagnosis not present

## 2021-11-15 DIAGNOSIS — Z8632 Personal history of gestational diabetes: Secondary | ICD-10-CM | POA: Diagnosis present

## 2021-11-15 DIAGNOSIS — B009 Herpesviral infection, unspecified: Secondary | ICD-10-CM | POA: Diagnosis present

## 2021-11-15 DIAGNOSIS — Z8751 Personal history of pre-term labor: Secondary | ICD-10-CM

## 2021-11-15 DIAGNOSIS — O24420 Gestational diabetes mellitus in childbirth, diet controlled: Secondary | ICD-10-CM | POA: Diagnosis present

## 2021-11-15 DIAGNOSIS — Z7982 Long term (current) use of aspirin: Secondary | ICD-10-CM | POA: Diagnosis not present

## 2021-11-15 DIAGNOSIS — O24419 Gestational diabetes mellitus in pregnancy, unspecified control: Secondary | ICD-10-CM | POA: Diagnosis present

## 2021-11-15 DIAGNOSIS — I10 Essential (primary) hypertension: Principal | ICD-10-CM | POA: Diagnosis present

## 2021-11-15 LAB — CBC
HCT: 38.7 % (ref 36.0–46.0)
Hemoglobin: 12.9 g/dL (ref 12.0–15.0)
MCH: 31.1 pg (ref 26.0–34.0)
MCHC: 33.3 g/dL (ref 30.0–36.0)
MCV: 93.3 fL (ref 80.0–100.0)
Platelets: 287 10*3/uL (ref 150–400)
RBC: 4.15 MIL/uL (ref 3.87–5.11)
RDW: 13.5 % (ref 11.5–15.5)
WBC: 13.5 10*3/uL — ABNORMAL HIGH (ref 4.0–10.5)
nRBC: 0 % (ref 0.0–0.2)

## 2021-11-15 LAB — TYPE AND SCREEN
ABO/RH(D): O POS
Antibody Screen: NEGATIVE

## 2021-11-15 LAB — GLUCOSE, CAPILLARY
Glucose-Capillary: 85 mg/dL (ref 70–99)
Glucose-Capillary: 65 mg/dL — ABNORMAL LOW (ref 70–99)

## 2021-11-15 LAB — RPR: RPR Ser Ql: NONREACTIVE

## 2021-11-15 MED ORDER — LACTATED RINGERS IV SOLN: 500.0000 mL | Freq: Once | INTRAVENOUS | Status: DC

## 2021-11-15 MED ORDER — FENTANYL-BUPIVACAINE-NACL 0.5-0.125-0.9 MG/250ML-% EP SOLN
12.0000 mL/h | EPIDURAL | Status: DC | PRN
  Filled 2021-11-15: qty 250

## 2021-11-15 MED ORDER — TETANUS-DIPHTH-ACELL PERTUSSIS 5-2.5-18.5 LF-MCG/0.5 IM SUSY
0.5000 mL | PREFILLED_SYRINGE | Freq: Once | INTRAMUSCULAR | Status: DC
Start: 2021-11-16 — End: 2021-11-16

## 2021-11-15 MED ORDER — FENTANYL-BUPIVACAINE-NACL 0.5-0.125-0.9 MG/250ML-% EP SOLN
EPIDURAL | Status: DC | PRN
  Administered 2021-11-15: 12 mL/h via EPIDURAL

## 2021-11-15 MED ORDER — PHENYLEPHRINE 80 MCG/ML (10ML) SYRINGE FOR IV PUSH (FOR BLOOD PRESSURE SUPPORT): 80.0000 ug | PREFILLED_SYRINGE | INTRAVENOUS | Status: DC | PRN

## 2021-11-15 MED ORDER — OXYCODONE HCL 5 MG PO TABS: 5.0000 mg | ORAL_TABLET | ORAL | Status: DC | PRN

## 2021-11-15 MED ORDER — LIDOCAINE HCL (PF) 1 % IJ SOLN: 30.0000 mL | INTRAMUSCULAR | Status: DC | PRN

## 2021-11-15 MED ORDER — LACTATED RINGERS IV SOLN
500.0000 mL | INTRAVENOUS | Status: DC | PRN
  Administered 2021-11-15: 500 mL via INTRAVENOUS

## 2021-11-15 MED ORDER — COCONUT OIL OIL: 1.0000 "application " | TOPICAL_OIL | Status: DC | PRN

## 2021-11-15 MED ORDER — OXYCODONE-ACETAMINOPHEN 5-325 MG PO TABS: 2.0000 | ORAL_TABLET | ORAL | Status: DC | PRN

## 2021-11-15 MED ORDER — ACETAMINOPHEN 325 MG PO TABS: 650.0000 mg | ORAL_TABLET | ORAL | Status: DC | PRN

## 2021-11-15 MED ORDER — ONDANSETRON HCL 4 MG PO TABS: 4.0000 mg | ORAL_TABLET | ORAL | Status: DC | PRN

## 2021-11-15 MED ORDER — MEASLES, MUMPS & RUBELLA VAC IJ SOLR: 0.5000 mL | Freq: Once | INTRAMUSCULAR | Status: DC

## 2021-11-15 MED ORDER — EPHEDRINE 5 MG/ML INJ: 10.0000 mg | INTRAVENOUS | Status: DC | PRN

## 2021-11-15 MED ORDER — WITCH HAZEL-GLYCERIN EX PADS: 1.0000 "application " | MEDICATED_PAD | CUTANEOUS | Status: DC | PRN

## 2021-11-15 MED ORDER — SIMETHICONE 80 MG PO CHEW: 80.0000 mg | CHEWABLE_TABLET | ORAL | Status: DC | PRN

## 2021-11-15 MED ORDER — FLEET ENEMA 7-19 GM/118ML RE ENEM: 1.0000 | ENEMA | RECTAL | Status: DC | PRN

## 2021-11-15 MED ORDER — HYDROXYZINE HCL 50 MG PO TABS
50.0000 mg | ORAL_TABLET | Freq: Four times a day (QID) | ORAL | Status: DC | PRN
Start: 2021-11-15 — End: 2021-11-15

## 2021-11-15 MED ORDER — PRENATAL MULTIVITAMIN CH
1.0000 | ORAL_TABLET | Freq: Every day | ORAL | Status: DC
  Administered 2021-11-15 – 2021-11-16 (×2): 1 via ORAL
  Filled 2021-11-15 (×2): qty 1

## 2021-11-15 MED ORDER — OXYTOCIN-SODIUM CHLORIDE 30-0.9 UT/500ML-% IV SOLN: 2.5000 [IU]/h | INTRAVENOUS | Status: DC

## 2021-11-15 MED ORDER — DIPHENHYDRAMINE HCL 25 MG PO CAPS: 25.0000 mg | ORAL_CAPSULE | Freq: Four times a day (QID) | ORAL | Status: DC | PRN

## 2021-11-15 MED ORDER — OXYTOCIN-SODIUM CHLORIDE 30-0.9 UT/500ML-% IV SOLN: 1.0000 m[IU]/min | INTRAVENOUS | Status: DC

## 2021-11-15 MED ORDER — LIDOCAINE HCL (PF) 1 % IJ SOLN
INTRAMUSCULAR | Status: DC | PRN
Start: 2021-11-15 — End: 2021-11-15
  Administered 2021-11-15: 2 mL via EPIDURAL
  Administered 2021-11-15: 10 mL via EPIDURAL

## 2021-11-15 MED ORDER — BENZOCAINE-MENTHOL 20-0.5 % EX AERO: 1.0000 "application " | INHALATION_SPRAY | CUTANEOUS | Status: DC | PRN

## 2021-11-15 MED ORDER — OXYTOCIN-SODIUM CHLORIDE 30-0.9 UT/500ML-% IV SOLN
1.0000 m[IU]/min | INTRAVENOUS | Status: DC
  Administered 2021-11-15: 2 m[IU]/min via INTRAVENOUS
  Filled 2021-11-15: qty 500

## 2021-11-15 MED ORDER — SOD CITRATE-CITRIC ACID 500-334 MG/5ML PO SOLN: 30.0000 mL | ORAL | Status: DC | PRN

## 2021-11-15 MED ORDER — OXYCODONE-ACETAMINOPHEN 5-325 MG PO TABS: 1.0000 | ORAL_TABLET | ORAL | Status: DC | PRN

## 2021-11-15 MED ORDER — ZOLPIDEM TARTRATE 5 MG PO TABS: 5.0000 mg | ORAL_TABLET | Freq: Every evening | ORAL | Status: DC | PRN

## 2021-11-15 MED ORDER — AMLODIPINE BESYLATE 5 MG PO TABS
5.0000 mg | ORAL_TABLET | Freq: Every day | ORAL | Status: DC
  Administered 2021-11-16: 5 mg via ORAL
  Filled 2021-11-15 (×3): qty 1

## 2021-11-15 MED ORDER — ONDANSETRON HCL 4 MG/2ML IJ SOLN: 4.0000 mg | Freq: Four times a day (QID) | INTRAMUSCULAR | Status: DC | PRN

## 2021-11-15 MED ORDER — DIPHENHYDRAMINE HCL 50 MG/ML IJ SOLN: 12.5000 mg | INTRAMUSCULAR | Status: DC | PRN

## 2021-11-15 MED ORDER — DIBUCAINE (PERIANAL) 1 % EX OINT: 1.0000 "application " | TOPICAL_OINTMENT | CUTANEOUS | Status: DC | PRN

## 2021-11-15 MED ORDER — AMLODIPINE BESYLATE 5 MG PO TABS: 5.0000 mg | ORAL_TABLET | Freq: Every day | ORAL | Status: DC

## 2021-11-15 MED ORDER — OXYTOCIN BOLUS FROM INFUSION
333.0000 mL | Freq: Once | INTRAVENOUS | Status: AC
  Administered 2021-11-15: 333 mL via INTRAVENOUS

## 2021-11-15 MED ORDER — IBUPROFEN 600 MG PO TABS
600.0000 mg | ORAL_TABLET | Freq: Four times a day (QID) | ORAL | Status: DC
  Administered 2021-11-15 – 2021-11-16 (×5): 600 mg via ORAL
  Filled 2021-11-15 (×5): qty 1

## 2021-11-15 MED ORDER — TERBUTALINE SULFATE 1 MG/ML IJ SOLN: 0.2500 mg | Freq: Once | INTRAMUSCULAR | Status: DC | PRN

## 2021-11-15 MED ORDER — ONDANSETRON HCL 4 MG/2ML IJ SOLN: 4.0000 mg | INTRAMUSCULAR | Status: DC | PRN

## 2021-11-15 MED ORDER — FENTANYL CITRATE (PF) 100 MCG/2ML IJ SOLN: 100.0000 ug | INTRAMUSCULAR | Status: DC | PRN

## 2021-11-15 MED ORDER — SENNOSIDES-DOCUSATE SODIUM 8.6-50 MG PO TABS
2.0000 | ORAL_TABLET | ORAL | Status: DC
  Administered 2021-11-15: 2 via ORAL
  Filled 2021-11-15: qty 2

## 2021-11-15 MED ORDER — LACTATED RINGERS IV SOLN: INTRAVENOUS | Status: DC

## 2021-11-15 NOTE — H&P (Addendum)
Meghan Phelps is a 31 y.o. [phone] female at 80w0dby LMP presenting for IOL d/t CHTN (Norvasc) and A1GDM (well controlled).   ?Reports active fetal movement, contractions: irregular, vaginal bleeding: scant staining, membranes: intact.  ?Initiated prenatal care at [facility] at 12.5 wks.   ?Most recent u/s 11/06/21: 37w5dEFW 86% (AC 97%), AFI 17cm, vtx, normal dopplers ? ?This pregnancy complicated by: ?CHTN, A1GDM ? ?Prenatal History/Complications:  ?HSV1 (Genital outbreak 2020), hx of preterm delivery (2022), Rubella Non-immune ? ?Past Medical History: ?Past Medical History:  ?Diagnosis Date  ? Gestational diabetes   ? 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 Hypertension   ? ? ?Past Surgical History: ?Past Surgical History:  ?Procedure Laterality Date  ? CHOLECYSTECTOMY    ? TONSILLECTOMY    ? ? ?Obstetrical History: ?OB History   ? ? Gravida  ?3  ? Para  ?1  ? Term  ?0  ? Preterm  ?1  ? AB  ?1  ? Living  ?1  ?  ? ? SAB  ?1  ? IAB  ?   ? Ectopic  ?   ? Multiple  ?   ? Live Births  ?1  ?   ?  ?  ? ? ?Social History: ?Social History  ? ?Socioeconomic History  ? Marital status: Married  ?  Spouse name: Not on file  ? Number of children: 1  ? Years of education: Not on file  ? Highest education level: Not on file  ?Occupational History  ? Occupation: RNTherapist, sports ?Tobacco Use  ? Smoking status: Former  ?  Packs/day: 0.50  ?  Types: Cigarettes  ? Smokeless tobacco: Never  ?Vaping Use  ? Vaping Use: Never used  ?Substance and Sexual Activity  ? Alcohol use: Not Currently  ?  Comment: Occ  ? Drug use: No  ? Sexual activity: Yes  ?  Birth control/protection: None  ?Other Topics Concern  ? Not on file  ?Social History Narrative  ? Not on file  ? ?Social Determinants of Health  ? ?Financial Resource Strain: Low Risk   ? Difficulty of Paying Living Expenses: Not very hard  ?Food Insecurity: No Food Insecurity  ? Worried About RuCharity fundraisern the Last Year: Never true  ? Ran Out of Food in the Last Year: Never true   ?Transportation Needs: No Transportation Needs  ? Lack of Transportation (Medical): No  ? Lack of Transportation (Non-Medical): No  ?Physical Activity: Insufficiently Active  ? Days of Exercise per Week: 3 days  ? Minutes of Exercise per Session: 30 min  ?Stress: Stress Concern Present  ? Feeling of Stress : To some extent  ?Social Connections: Moderately Isolated  ? Frequency of Communication with Friends and Family: More than three times a week  ? Frequency of Social Gatherings with Friends and Family: Twice a week  ? Attends Religious Services: Never  ? Active Member of Clubs or Organizations: No  ? Attends ClArchivisteetings: Never  ? Marital Status: Married  ? ? ?Family History: ?Family History  ?Problem Relation Age of Onset  ? Hypertension Maternal Grandmother   ? ? ?Allergies: ?No Known Allergies ? ?Medications Prior to Admission  ?Medication Sig Dispense Refill Last Dose  ? acyclovir (ZOVIRAX) 400 MG tablet Take 1 tablet (400 mg total) by mouth 3 (three) times daily. 90 tablet 3 11/14/2021  ? amLODipine (NORVASC) 5 MG tablet Take 1 tablet (5 mg total)  by mouth daily. 30 tablet 11 11/14/2021  ? aspirin 81 MG EC tablet Take 2 tablets (162 mg total) by mouth daily. Swallow whole. 180 tablet 2 11/14/2021  ? prenatal vitamin w/FE, FA (PRENATAL 1 + 1) 27-1 MG TABS tablet Take 1 tablet by mouth daily at 12 noon. 30 tablet 12 11/14/2021  ? Blood Glucose Monitoring Suppl (ONETOUCH VERIO) w/Device KIT Use as directed to check blood sugar 4 times daily 1 kit 0   ? glucose blood test strip Use as instructed 100 each PRN   ? Lancets (ONETOUCH ULTRASOFT) lancets Use as directed to check blood sugar 4 times daily 100 each PRN   ? promethazine (PHENERGAN) 25 MG tablet TAKE 1 TABLET BY MOUTH EVERY 6 HOURS AS NEEDED FOR NAUSEA OR VOMITING 30 tablet 0 More than a month  ? ? ?Review of Systems  ?Pertinent pos/neg as indicated in HPI ? ?Temperature 98.2 ?F (36.8 ?C), temperature source Oral, resp. rate 16, last  menstrual period 02/22/2021. ?General appearance: alert and cooperative ?Lungs: clear to auscultation bilaterally ?Heart: regular rate and rhythm ?Abdomen: gravid, soft, non-tender, EFW by Leopold's approximately 3387gms/ 86% 11/06/21  ?Extremities: pitting edema ?DTR's not examined ? ?Fetal monitoring: FHR: 130 bpm, variability: moderate,  Accelerations: ?Present,  decelerations:  Absent ?Uterine activity: irregular ?Dilation: 4 ?Effacement (%): 50 ?Station: -2 ?Exam by:: Meghan Phelps, SNM ?Presentation: cephalic ? ? ?Prenatal labs: ?ABO, Rh: --/--/PENDING (04/20 0015) ?Antibody: PENDING (04/20 0015) ?Rubella: <0.90 (10/25 1129) ?RPR: Non Reactive (01/31 0840)  ?HBsAg: Negative (10/25 1129)  ?HIV: Non Reactive (01/31 0840)  ?GBS: Negative/-- (04/11 1231)  ?2hr GTT: 72/184/141 ? ?Prenatal Transfer Tool  ?Maternal Diabetes: Yes:  Diabetes Type:  Diet controlled ?Genetic Screening: Normal ?Maternal Ultrasounds/Referrals: Normal ?Fetal Ultrasounds or other Referrals:  None ?Maternal Substance Abuse:  No ?Significant Maternal Medications:  None ?Significant Maternal Lab Results: Group B Strep negative ? ?Results for orders placed or performed during the hospital encounter of 11/15/21 (from the past 24 hour(s))  ?Type and screen  ? Collection Time: 11/15/21 12:15 AM  ?Result Value Ref Range  ? ABO/RH(D) PENDING   ? Antibody Screen PENDING   ? Sample Expiration    ?  11/18/2021,2359 ?Performed at [facility], [place] [address], [place], [place] [address] ?  ?CBC  ? Collection Time: 11/15/21 12:19 AM  ?Result Value Ref Range  ? WBC 13.5 (H) 4.0 - 10.5 K/uL  ? RBC 4.15 3.87 - 5.11 MIL/uL  ? Hemoglobin 12.9 12.0 - 15.0 g/dL  ? HCT 38.7 36.0 - 46.0 %  ? MCV 93.3 80.0 - 100.0 fL  ? MCH 31.1 26.0 - 34.0 pg  ? MCHC 33.3 30.0 - 36.0 g/dL  ? RDW 13.5 11.5 - 15.5 %  ? Platelets 287 150 - 400 K/uL  ? nRBC 0.0 0.0 - 0.2 %  ?  ? ?Assessment: ? 50w0dSIUP ? [id number]? CHTN (Norvasc) and A1GDM (well controlled) ? Cat 1 FHR ? GBS  Negative/-- (04/11 1231) ? ?Plan: ? Admit to L&D ? IV pain meds/epidural prn active labor ? Active management: Pitocin 2x2 ? Continuous BP monitoring and q4hrs CBGs ? Anticipate vaginal birth  ? Plans to breastfeed/bottle ? Contraception: Mirena (Outpatient) ? Circumcision: Yes ? Recommend MMR pp ? ?AOrange,SNM ?11/15/2021, 12:48 AM  ? ?CNM attestation: ? ?I have seen and examined this patient; I agree with above documentation in the nurse midwife student's note.  ? ?JSIMONNE BOULOSis a 31y.o. [phone]  here for IOL due to cHTN on Norvasc 31m, also with well-controlled GDMA1 ? ?PE: ?BP 102/60   Pulse 93   Temp 98.1 ?F (36.7 ?C) (Oral)   Resp 16   Ht 5' 4"  (1.626 m)   Wt 88.3 kg   LMP 02/22/2021 (Exact Date)   BMI 33.42 kg/m?  ?Gen: calm comfortable, NAD ?Resp: normal effort, no distress ?Abd: gravid ? ?ROS, labs, PMH reviewed ?CBG: 85 ? ?Plan: ?Admit to L&D ?Plan Pitocin as induction method since cx is favorable with consideration for AROM once laboring ?Watch BPs and continue home Norvasc 524m?Anticipate vag del ?Recommend MMR pp ? ?KiMyrtis SerNM ?11/15/2021, 4:48 AM  ?

## 2021-11-15 NOTE — Progress Notes (Signed)
Delayed documentation ? ?Assessed patient on L&D floor for bleeding ? ?Received call from patient's L&D RN that passed two dime sized clots and continuing to have trickle. Fundus firm on exam. No heavy bleeding or clots with fundal pressure on nursing exam. ? ?I went to assess patient. Appears comfortable with infant on chest and talking with lactation consultant. Vital signs stable with BP in 120s ? ?On my exam: ?Assessed pad. Pad with ice pad appears mostly saturated but no big clots. On fundal rub small amount of trickling noted. Fundus very firm and 1cm below umbilicus.  ? ?A/P: ?Discussed with patient that likely etiology is blood clot in LUS given fundus firm. Offered LUS sweep and patient amenable especially since she is still comfortable from her epidural. Sweep performed and lime size clot removed with EBL 169 on triton. Fundus remains firm. No other clots appreciated in LUS. Patient tolerated procedure well. Fundal rub after removal of clot without any bleeding. Hold off on methergine at this time give hx of cHTN but could consider if trickling continues. ? ?Warner Mccreedy, MD, MPH ?OB Fellow, Faculty Practice ? ? ? ?

## 2021-11-15 NOTE — Anesthesia Procedure Notes (Signed)
Epidural ?Patient location during procedure: OB ?Start time: 11/15/2021 6:16 AM ?End time: 11/15/2021 6:24 AM ? ?Staffing ?Anesthesiologist: Lannie Fields, DO ?Performed: anesthesiologist  ? ?Preanesthetic Checklist ?Completed: patient identified, IV checked, risks and benefits discussed, monitors and equipment checked, pre-op evaluation and timeout performed ? ?Epidural ?Patient position: sitting ?Prep: DuraPrep and site prepped and draped ?Patient monitoring: continuous pulse ox, blood pressure, heart rate and cardiac monitor ?Approach: midline ?Location: L3-L4 ?Injection technique: LOR air ? ?Needle:  ?Needle type: Tuohy  ?Needle gauge: 17 G ?Needle length: 9 cm ?Needle insertion depth: 6 cm ?Catheter type: closed end flexible ?Catheter size: 19 Gauge ?Catheter at skin depth: 11 cm ?Test dose: negative ? ?Assessment ?Sensory level: T8 ?Events: blood not aspirated, injection not painful, no injection resistance, no paresthesia and negative IV test ? ?Additional Notes ?Patient identified. Risks/Benefits/Options discussed with patient including but not limited to bleeding, infection, nerve damage, paralysis, failed block, incomplete pain control, headache, blood pressure changes, nausea, vomiting, reactions to medication both or allergic, itching and postpartum back pain. Confirmed with bedside nurse the patient's most recent platelet count. Confirmed with patient that they are not currently taking any anticoagulation, have any bleeding history or any family history of bleeding disorders. Patient expressed understanding and wished to proceed. All questions were answered. Sterile technique was used throughout the entire procedure. Please see nursing notes for vital signs. Test dose was given through epidural catheter and negative prior to continuing to dose epidural or start infusion. Warning signs of high block given to the patient including shortness of breath, tingling/numbness in hands, complete motor  block, or any concerning symptoms with instructions to call for help. Patient was given instructions on fall risk and not to get out of bed. All questions and concerns addressed with instructions to call with any issues or inadequate analgesia.  Reason for block:procedure for pain ? ? ? ?

## 2021-11-15 NOTE — Lactation Note (Signed)
This note was copied from a baby's chart. ?Lactation Consultation Note ?Mom is [employer] Employee wanting her Employee pump. ?LC gave mom list to choose from. LC made copy of insurance card and pump given. ?Discussed BF w/mom. Mom stated it is getting better. ?Last glucose was 42. Suggested mom spoon feed colostrum after BF to keep glucose from dropping again. ? Mom ok with that. Encouraged mom to call for [facility] tonight for latch assist. ? ?Patient Name: Meghan Phelps ?Today's Date: 11/15/2021 ?Reason for consult: Follow-up assessment;Maternal endocrine disorder;Early term 37-38.6wks ?Age:31 hours ? ?Maternal Data ?Does the patient have breastfeeding experience prior to this delivery?: No ? ?Feeding ?  ? ?LATCH Score ?  ? ?  ? ?  ? ?  ? ?  ? ?  ? ? ?Lactation Tools Discussed/Used ?  ? ?Interventions ?  ? ?Discharge ?  ? ?Consult Status ?Consult Status: Follow-up ?Date: 11/16/21 ?Follow-up type: In-patient ? ? ? ?Charyl Dancer ?11/15/2021, 8:17 PM ? ? ? ?

## 2021-11-15 NOTE — Progress Notes (Signed)
Meghan Phelps is a 31 y.o. [phone] at [redacted]w[redacted]d admitted for induction of labor due to chronic hypertension  and diabetes mellitus A1DM ? ?Subjective: ?Nurse at bedside. Patient states leaking of fluid and RN confirmed SROM. Patient states pain level 3/10. She states she feels her contractions but are tolerable. She declines sterile vaginal exam and denies any needs at this moment.  ? ?Objective: ?BP 102/60   Pulse 93   Temp 98.1 ?F (36.7 ?C) (Oral)   Resp 16   Ht 5\' 4"  (1.626 m)   Wt 88.3 kg   LMP 02/22/2021 (Exact Date)   BMI 33.42 kg/m?  ?Total I/O ?In: 811.5 [P.O.:512; I.V.:299.5] ?Out: -  ? ?FHR baseline 135 bpm, Variability: moderate, Accelerations:present, Decelerations:  Absent ?Toco: 1-6 minutes  ? ?SVE:   Dilation: 4 ?Effacement (%): 50 ?Station: -2 ?Exam by:: [email or web address], SNM ? ?Pitocin @ 12 mu/min ? ?Labs: ?Lab Results  ?Component Value Date  ? WBC 13.5 (H) 11/15/2021  ? HGB 12.9 11/15/2021  ? HCT 38.7 11/15/2021  ? MCV 93.3 11/15/2021  ? PLT 287 11/15/2021  ? ? ?Assessment / Plan: ?Active management: Pitocin 2x2. Pitocin at 60mu ?Pain management: Epidural/IV pain medications if needed ?Continuous BP monitoring for CHTN (Norvasc) and CBG q4hrs for A1GDM (well controlled) ?Labor:  early ?Fetal Wellbeing:  Category I ?Pain Control:  n/a ?Pre-eclampsia: N/A ?I/D:  GBS neg ?Anticipated MOD: hopeful for vaginal birth ? ?18m Charlayne Vultaggio, SNM ?11/15/2021, 4:45 AM  ?

## 2021-11-15 NOTE — Anesthesia Preprocedure Evaluation (Signed)
Anesthesia Evaluation  ?Patient identified by MRN, date of birth, ID band ?Patient awake ? ? ? ?Reviewed: ?Allergy & Precautions, Patient's Chart, lab work & pertinent test results ? ?Airway ?Mallampati: II ? ?TM Distance: >3 FB ?Neck ROM: Full ? ? ? Dental ?no notable dental hx. ? ?  ?Pulmonary ?neg pulmonary ROS, former smoker,  ?  ?Pulmonary exam normal ?breath sounds clear to auscultation ? ? ? ? ? ? Cardiovascular ?hypertension, Pt. on medications ?Normal cardiovascular exam ?Rhythm:Regular Rate:Normal ? ? ?  ?Neuro/Psych ?negative neurological ROS ? negative psych ROS  ? GI/Hepatic ?negative GI ROS, Neg liver ROS,   ?Endo/Other  ?diabetes, Well Controlled, Gestational ? Renal/GU ?negative Renal ROS  ?negative genitourinary ?  ?Musculoskeletal ?negative musculoskeletal ROS ?(+)  ? Abdominal ?  ?Peds ?negative pediatric ROS ?(+)  Hematology ?negative hematology ROS ?(+) Hb 12.9, plt 287   ?Anesthesia Other Findings ? ? Reproductive/Obstetrics ?(+) Pregnancy ? ?  ? ? ? ? ? ? ? ? ? ? ? ? ? ?  ?  ? ? ? ? ? ? ? ? ?Anesthesia Physical ?Anesthesia Plan ? ?ASA: 3 ? ?Anesthesia Plan: Epidural  ? ?Post-op Pain Management:   ? ?Induction:  ? ?PONV Risk Score and Plan: 2 ? ?Airway Management Planned: Natural Airway ? ?Additional Equipment: None ? ?Intra-op Plan:  ? ?Post-operative Plan:  ? ?Informed Consent: I have reviewed the patients History and Physical, chart, labs and discussed the procedure including the risks, benefits and alternatives for the proposed anesthesia with the patient or authorized representative who has indicated his/her understanding and acceptance.  ? ? ? ? ? ?Plan Discussed with:  ? ?Anesthesia Plan Comments:   ? ? ? ? ? ? ?Anesthesia Quick Evaluation ? ?

## 2021-11-15 NOTE — Lactation Note (Signed)
This note was copied from a baby's chart. ?Lactation Consultation Note ? ?Patient Name: Meghan Phelps ?Today's Date: 11/15/2021 ?Reason for consult: Initial assessment ?Age:33 hours ? ?Mom states baby has had a low blood sugar.   ? ?Mom is unsure if baby is latching well. ? ?LC assisted with latch.  Infant continue to thrust nipple and suck his tongue when trying to latch.  Suck training done with colostrum on gloved finger.   ?Infant attempted to latch in football without success.  LC placed baby in laid back position and infant rooted and latched.  Swallows heard and rhythmic sucking noted.  Mom felt tug without discomfort.  ? ?LC taught parents to listen for swallows.   ? ?LC encouraged STS, gentle breast massage when baby is feeding.   ?LC will message Soyla Dryer for OP follow up. [facility].   ? ? ?Maternal Data ?Has patient been taught Hand Expression?: Yes ? ?Feeding ?Mother's Current Feeding Choice: Breast Milk ? ?LATCH Score ?Latch: Grasps breast easily, tongue down, lips flanged, rhythmical sucking. ? ?Audible Swallowing: A few with stimulation ? ?Type of Nipple: Everted at rest and after stimulation ? ?Comfort (Breast/Nipple): Soft / non-tender ? ?Hold (Positioning): Assistance needed to correctly position infant at breast and maintain latch. ? ?LATCH Score: 8 ? ? ?Lactation Tools Discussed/Used ?  ? ?Interventions ?Interventions: Breast feeding basics reviewed;Assisted with latch;Skin to skin;Breast massage;Support pillows;Adjust position ? ?Discharge ?  ? ?Consult Status ?Consult Status: Follow-up ?Date: 11/16/21 ?Follow-up type: In-patient ? ? ? ?Maryruth Hancock Tai Skelly ?11/15/2021, 3:34 PM ? ? ? ?

## 2021-11-15 NOTE — Progress Notes (Signed)
Patient ID: Meghan Phelps, female   DOB: 15-May-1991, 31 y.o.   MRN: [id number] ? ?Comfortable w epidural; has had Pitocin going all night with SROM @ 0433 prior to becoming more uncomfortable ? ?BPs 100/59, 95/51 ?FHR 125, +accels, occ variables ?Ctx q 2 mins with Pit @ 45mu/min ?Cx was 6/80/vtx -2 at [phone] ? ?CBG 85 ? ?IUP@38 .0wks ?cHTN ?GDMA1 ?Early active labor ? ?Plan cx check in a couple of hours or sooner w urge to push ?Anticipate vag del ? ?Arabella Merles CNM ?11/15/2021 ?7:45 AM ? ?

## 2021-11-15 NOTE — Lactation Note (Signed)
This note was copied from a baby's chart. ?Lactation Consultation Note ? ?Patient Name: Meghan Phelps ?Today's Date: 11/15/2021 ?Reason for consult: L&D Initial assessment ?Age:33 hours ? ? ?P73 mom has 30 year old she attempted to breastfeed for 1 week.  Could not feed due to "low milk supply". ?Infant STS. Latched with assistance.  Mom is able to hand express several drops easily. ?BAby sucked rhythmically and swallows were heard.   ? ?LC praised mom and encouraged her to continue breastfeeding when baby cues.   ? 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   ? ? ? ? ?Maternal Data ?Has patient been taught Hand Expression?: Yes ?Does the patient have breastfeeding experience prior to this delivery?: Yes ?How long did the patient breastfeed?: attempted for 5 days in the hospital then 3 days at home; states milk never came in with her now 57 year old son. ? ?Feeding ?Mother's Current Feeding Choice: Breast Milk ? ?LATCH Score ?Latch: Repeated attempts needed to sustain latch, nipple held in mouth throughout feeding, stimulation needed to elicit sucking reflex. ? ?Audible Swallowing: A few with stimulation ? ?Type of Nipple: Everted at rest and after stimulation ? ?Comfort (Breast/Nipple): Soft / non-tender ? ?Hold (Positioning): Assistance needed to correctly position infant at breast and maintain latch. ? ?LATCH Score: 7 ? ? ?Lactation Tools Discussed/Used ?  ? ?Interventions ?Interventions: Breast feeding basics reviewed;Assisted with latch;Skin to skin;Education ? ?Discharge ?Pump: Employee Pump ? ?Consult Status ?Consult Status: Follow-up from L&D ?Date: 11/15/21 ?Follow-up type: In-patient ? ? ? ?Maryruth Hancock Sahalie Beth ?11/15/2021, 9:42 AM ? ? ? ?

## 2021-11-15 NOTE — Discharge Summary (Signed)
? ?  Postpartum Discharge Summary ? ? ?   ?Patient Name: Meghan Phelps ?DOB: 1991/01/31 ?MRN: [id number] ? ?Date of admission: 11/15/2021 ?Delivery date:11/15/2021  ?Delivering provider: Serita Grammes D  ?Date of discharge: 11/16/2021 ? ?Admitting diagnosis: Chronic hypertension [I10] ?Intrauterine pregnancy: [redacted]w[redacted]d    ?Secondary diagnosis:  Principal Problem: ?  Chronic hypertension ?Active Problems: ?  Herpes ?  History of preterm delivery ?  Rubella non-immune status, antepartum ?  Gestational diabetes ? ?Additional problems: none    ?Discharge diagnosis: Term Pregnancy Delivered, CHTN, and GDM A1                                              ?Post partum procedures: none ?Augmentation: Pitocin ?Complications: None ? ?Hospital course: Induction of Labor With Vaginal Delivery   ?31y.o. yo [id number] 348w0das admitted to the hospital 11/15/2021 for induction of labor.  Indication for induction:  cHTN on Norvasc 83m81malso with GDMA1 .  Patient had an uncomplicated labor course as follows: ?Membrane Rupture Time/Date: 4:33 AM ,11/15/2021   ?Delivery Method:Vaginal, Spontaneous  ?Episiotomy: None  ?Lacerations:  None  ?Details of delivery can be found in separate delivery note.  Patient had a routine postpartum course. Patient is discharged home 11/16/21. ? ?Newborn Data: ?Birth date:11/15/2021  ?Birth time:8:16 AM  ?Gender:Female  ?Living status:Living  ?Apgars:9 ,9  ?Weight:3351 g  ? ?Magnesium Sulfate received: No ?BMZ received: No ?Rhophylac:N/A ?MMR:Yes (recommended postpartum) ?T-DaP:Given prenatally ?Flu: Yes ?Transfusion:No ? ?Physical exam  ?Vitals:  ? 11/15/21 1600 11/15/21 2006 11/15/21 2345 11/16/21 0511  ?BP: 112/71 126/81 120/70 119/85  ?Pulse: 75 86 83 86  ?Resp:  _0 ?Temp: 98.4 ?F (36.9 ?C) 98.1 ?F (36.7 ?C) 98.4 ?F (36.9 ?C) 98.1 ?F (36.7 ?C)  ?TempSrc: Oral Oral Axillary Oral  ?SpO2: 99% 98% 99% 98%  ?Weight:      ?Height:      ? ?General: alert, cooperative, and no distress ?Lochia:  appropriate ?Uterine Fundus: firm ?Incision: N/A ?DVT Evaluation: No evidence of DVT seen on physical exam. ?No cords or calf tenderness. ?No significant calf/ankle edema. ?Labs: ?Lab Results  ?Component Value Date  ? WBC 13.5 (H) 11/15/2021  ? HGB 12.9 11/15/2021  ? HCT 38.7 11/15/2021  ? MCV 93.3 11/15/2021  ? PLT 287 11/15/2021  ? ? ?  Latest Ref Rng & Units 05/22/2021  ? 11:29 AM  ?CMP  ?Glucose 70 - 99 mg/dL 72    ?BUN 6 - 20 mg/dL 7    ?Creatinine 0.57 - 1.00 mg/dL 0.56    ?Sodium 134 - 144 mmol/L 138    ?Potassium 3.5 - 5.2 mmol/L 4.1    ?Chloride 96 - 106 mmol/L 103    ?CO2 20 - 29 mmol/L 18    ?Calcium 8.7 - 10.2 mg/dL 9.5    ?Total Protein 6.0 - 8.5 g/dL 7.0    ?Total Bilirubin 0.0 - 1.2 mg/dL 0.2    ?Alkaline Phos 44 - 121 IU/L 72    ?AST 0 - 40 IU/L 27    ?ALT 0 - 32 IU/L 9    ? ?Edinburgh Score: ? ?  11/15/2021  ?  9:15 PM  ?Edinburgh Postnatal Depression Scale Screening Tool  ?I have been able to laugh and see the funny side of things. 0  ?I have looked forward with enjoyment  to things. 0  ?I have blamed myself unnecessarily when things went wrong. 1  ?I have been anxious or worried for no good reason. 2  ?I have felt scared or panicky for no good reason. 0  ?Things have been getting on top of me. 1  ?I have been so unhappy that I have had difficulty sleeping. 0  ?I have felt sad or miserable. 1  ?I have been so unhappy that I have been crying. 0  ?The thought of harming myself has occurred to me. 0  ?Edinburgh Postnatal Depression Scale Total 5  ? ? ? ?After visit meds:  ?Allergies as of 11/16/2021   ?No Known Allergies ?  ? ?  ?Medication List  ?  ? ?STOP taking these medications   ? ?acyclovir 400 MG tablet ?Commonly known as: ZOVIRAX ?  ?aspirin 81 MG EC tablet ?  ?glucose blood test strip ?  ?onetouch ultrasoft lancets ?  ?OneTouch Verio w/Device Kit ?  ?promethazine 25 MG tablet ?Commonly known as: PHENERGAN ?  ? ?  ? ?TAKE these medications   ? ?acetaminophen 325 MG tablet ?Commonly known as:  Tylenol ?Take 2 tablets (650 mg total) by mouth every 4 (four) hours as needed (for pain scale < 4). ?  ?amLODipine 5 MG tablet ?Commonly known as: NORVASC ?Take 1 tablet (5 mg total) by mouth daily. ?  ?furosemide 20 MG tablet ?Commonly known as: LASIX ?Take 1 tablet (20 mg total) by mouth daily. ?  ?ibuprofen 600 MG tablet ?Commonly known as: ADVIL ?Take 1 tablet (600 mg total) by mouth every 6 (six) hours. ?  ?prenatal vitamin w/FE, FA 27-1 MG Tabs tablet ?Take 1 tablet by mouth daily at 12 noon. ?  ? ?  ? ? ? ?Discharge home in stable condition ?Infant Feeding: Breast ?Infant Disposition:home with mother ?Discharge instruction: per After Visit Summary and Postpartum booklet. ?Activity: Advance as tolerated. Pelvic rest for 6 weeks.  ?Diet: routine diet ?Future Appointments: ?Future Appointments  ?Date Time Provider [place]  ?11/23/2021  9:10 AM [facility] NURSE [facility]  ?12/28/2021  9:50 AM Luvenia Redden, PA-C [facility]  ? ?Follow up Visit: ? ?(Already has BP check and PP visit scheduled) ? ? ?11/16/2021 ?Noralee Stain, DO ? ?I personally saw and evaluated the patient, performing the key elements of the service. I developed and verified the management plan that is described in the resident's/student's note, and I agree with the content with my edits above. VSS, HRR&R, Resp unlabored, Legs neg.  ?Nigel Berthold, CNM ?11/16/2021 ?8:48 AM ?  ? ? ? ?

## 2021-11-15 NOTE — Progress Notes (Signed)
Hypoglycemic Event ? ?CBG: 65 ? ?Treatment: 4 oz juice/soda ? ?Symptoms: None ? ?Follow-up CBG: Time: 0915 CBG Result: 95 ? ?Possible Reasons for Event: Inadequate meal intake ? ?Comments/MD notified: Dr. Ephriam Jenkins 0830 ? ? ? ?Alysen Smylie A Jiovanny Burdell ? ? ?

## 2021-11-16 ENCOUNTER — Other Ambulatory Visit: Payer: 59

## 2021-11-16 ENCOUNTER — Other Ambulatory Visit (HOSPITAL_COMMUNITY): Payer: Self-pay

## 2021-11-16 LAB — GLUCOSE, CAPILLARY: Glucose-Capillary: 84 mg/dL (ref 70–99)

## 2021-11-16 MED ORDER — FUROSEMIDE 20 MG PO TABS
20.0000 mg | ORAL_TABLET | Freq: Every day | ORAL | 0 refills | Status: DC
Start: 2021-11-16 — End: 2021-11-23
  Filled 2021-11-16: qty 4, 4d supply, fill #0

## 2021-11-16 MED ORDER — ACETAMINOPHEN 325 MG PO TABS: 650.0000 mg | ORAL_TABLET | ORAL | Status: AC | PRN

## 2021-11-16 MED ORDER — IBUPROFEN 600 MG PO TABS
600.0000 mg | ORAL_TABLET | Freq: Four times a day (QID) | ORAL | 0 refills | Status: DC
  Filled 2021-11-16: qty 30, 8d supply, fill #0

## 2021-11-16 MED ORDER — FUROSEMIDE 20 MG PO TABS
20.0000 mg | ORAL_TABLET | Freq: Every day | ORAL | Status: DC
  Administered 2021-11-16: 20 mg via ORAL
  Filled 2021-11-16: qty 1

## 2021-11-16 NOTE — Anesthesia Postprocedure Evaluation (Signed)
Anesthesia Post Note ? ?Patient: Meghan Phelps ? ?Procedure(s) Performed: AN AD HOC LABOR EPIDURAL ? ?  ? ?Patient location during evaluation: Mother Baby ?Anesthesia Type: Epidural ?Level of consciousness: awake and alert and oriented ?Pain management: satisfactory to patient ?Vital Signs Assessment: post-procedure vital signs reviewed and stable ?Respiratory status: respiratory function stable ?Cardiovascular status: stable ?Postop Assessment: no headache, no backache, epidural receding, patient able to bend at knees, no signs of nausea or vomiting, adequate PO intake and able to ambulate ?Anesthetic complications: no ? ? ?No notable events documented. ? ?Last Vitals:  ?Vitals:  ? 11/15/21 2345 11/16/21 0511  ?BP: 120/70 119/85  ?Pulse: 83 86  ?Resp: 16 18  ?Temp: 36.9 ?C 36.7 ?C  ?SpO2: 99% 98%  ?  ?Last Pain:  ?Vitals:  ? 11/16/21 0715  ?TempSrc:   ?PainSc: 0-No pain  ? ?Pain Goal:   ? ?  ?  ?  ?  ?  ?  ?  ? ?Cara Aguino ? ? ? ? ?

## 2021-11-19 ENCOUNTER — Encounter: Payer: Self-pay | Admitting: Obstetrics & Gynecology

## 2021-11-19 MED ORDER — BUTALBITAL-APAP-CAFFEINE 50-325-40 MG PO TABS: 1.0000 | ORAL_TABLET | Freq: Four times a day (QID) | ORAL | 0 refills | Status: DC | PRN

## 2021-11-20 ENCOUNTER — Other Ambulatory Visit: Payer: 59

## 2021-11-20 ENCOUNTER — Encounter: Payer: 59 | Admitting: Women's Health

## 2021-11-20 ENCOUNTER — Encounter: Payer: Self-pay | Admitting: Family Medicine

## 2021-11-22 ENCOUNTER — Inpatient Hospital Stay (HOSPITAL_COMMUNITY): Payer: 59

## 2021-11-23 ENCOUNTER — Ambulatory Visit (INDEPENDENT_AMBULATORY_CARE_PROVIDER_SITE_OTHER): Payer: 59

## 2021-11-23 ENCOUNTER — Other Ambulatory Visit: Payer: 59

## 2021-11-23 VITALS — BP 114/75 | HR 78

## 2021-11-23 DIAGNOSIS — Z0130 Encounter for examination of blood pressure without abnormal findings: Secondary | ICD-10-CM

## 2021-11-23 NOTE — Progress Notes (Signed)
? ?  NURSE VISIT- BLOOD PRESSURE CHECK ? ?SUBJECTIVE:  ?Meghan Phelps is a 31 y.o. [phone] female here for BP check. She is postpartum, delivery date 11/15/21    ? ?HYPERTENSION ROS: ? ?Postpartum:  ?Severe headaches that don't go away with tylenol/other medicines: No  ?Visual changes (seeing spots/double/blurred vision) No  ?Severe pain under right breast breast or in center of upper chest No  ?Severe nausea/vomiting No  ?Taking medicines as instructed yes ? ?OBJECTIVE:  ?BP 114/75 (BP Location: Right Arm, Patient Position: Sitting, Cuff Size: Normal)   Pulse 78   LMP 02/22/2021 (Exact Date)   Breastfeeding No   ?Appearance alert, well appearing, and in no distress and oriented to person, place, and time. ? ?ASSESSMENT: ?Postpartum  blood pressure check ? ?PLAN: ?Discussed with Joellyn Haff, CNM, WHNP   ?Recommendations: no changes needed   ?Follow-up: as scheduled  ? ?Orlondo Holycross A [employer]or [place]  ?11/23/2021 ?9:22 AM  ?

## 2021-11-27 ENCOUNTER — Encounter: Payer: Self-pay | Admitting: *Deleted

## 2021-11-27 ENCOUNTER — Other Ambulatory Visit: Payer: 59

## 2021-11-27 ENCOUNTER — Encounter: Payer: 59 | Admitting: Women's Health

## 2021-12-28 ENCOUNTER — Encounter: Payer: Self-pay | Admitting: Medical

## 2021-12-28 ENCOUNTER — Ambulatory Visit (INDEPENDENT_AMBULATORY_CARE_PROVIDER_SITE_OTHER): Payer: 59 | Admitting: Medical

## 2021-12-28 DIAGNOSIS — I10 Essential (primary) hypertension: Secondary | ICD-10-CM

## 2021-12-28 DIAGNOSIS — Z8632 Personal history of gestational diabetes: Secondary | ICD-10-CM

## 2021-12-28 NOTE — Progress Notes (Signed)
Post Partum Visit Note  Meghan Phelps is a 31 y.o. [phone] female who presents for a postpartum visit. She is 6 weeks postpartum following a normal spontaneous vaginal delivery.  I have fully reviewed the prenatal and intrapartum course. The delivery was at 38.0 gestational weeks.  Anesthesia: epidural. Postpartum course has been uncomplicated. Baby is doing well. Baby is feeding by bottle - Similac Advance. Bleeding staining only. Bowel function is normal. Bladder function is normal. Patient is sexually active. Contraception method is none. Postpartum depression screening: negative.   Upstream - 12/28/21 1008       Pregnancy Intention Screening   Does the patient want to become pregnant in the next year? No    Does the patient's partner want to become pregnant in the next year? No    Would the patient like to discuss contraceptive options today? Yes      Contraception Wrap Up   Current Method No Contraceptive Precautions    End Method IUD or IUS            The pregnancy intention screening data noted above was reviewed. Potential methods of contraception were discussed. The patient elected to proceed with IUD or IUS.   Edinburgh Postnatal Depression Scale - 12/28/21 1010       Edinburgh Postnatal Depression Scale:  In the Past 7 Days   I have been able to laugh and see the funny side of things. 0    I have looked forward with enjoyment to things. 0    I have blamed myself unnecessarily when things went wrong. 0    I have been anxious or worried for no good reason. 1    I have felt scared or panicky for no good reason. 1    Things have been getting on top of me. 1    I have been so unhappy that I have had difficulty sleeping. 0    I have felt sad or miserable. 1    I have been so unhappy that I have been crying. 1    The thought of harming myself has occurred to me. 0    Edinburgh Postnatal Depression Scale Total 5             Health Maintenance Due  Topic Date  Due   URINE MICROALBUMIN  Never done   COVID-19 Vaccine (3 - Booster) 10/14/2019    The following portions of the patient's history were reviewed and updated as appropriate: allergies, current medications, past family history, past medical history, past social history, past surgical history, and problem list.  Review of Systems Pertinent items are noted in HPI.  Objective:  BP 120/85 (BP Location: Right Arm, Patient Position: Sitting, Cuff Size: Normal)   Pulse 96   Ht 5\' 4"  (1.626 m)   Wt 180 lb 9.6 oz (81.9 kg)   LMP 02/22/2021 (Exact Date)   Breastfeeding No   BMI 31.00 kg/m    General:  alert and cooperative   Breasts:  not indicated  Lungs: clear to auscultation bilaterally  Heart:  regular rate and rhythm, S1, S2 normal, no murmur, click, rub or gallop  Abdomen: soft, non-tender; bowel sounds normal; no masses,  no organomegaly   Wound N/A  GU exam:  not indicated       Assessment:    1. Routine postpartum follow-up  2. History of gestational diabetes - Will schedule 2 hour PP GTT as soon as possible   3. Chronic hypertension -  No meds current, will refer to PCP    Plan:   Essential components of care per ACOG recommendations:  1.  Mood and well being: Patient with negative depression screening today. Reviewed local resources for support.  - Patient tobacco use? No.   - hx of drug use? No.    2. Infant care and feeding:  -Patient currently breastmilk feeding? No.  -Social determinants of health (SDOH) reviewed in EPIC. No concerns  3. Sexuality, contraception and birth spacing - Patient does not want a pregnancy in the next year.  Desired family size is 2 children.  - Reviewed reproductive life planning. Reviewed contraceptive methods based on pt preferences and effectiveness.  Patient desired IUD or IUS today.  Patient had sex today. Advised of need for abstinence x 2 weeks and return for insertion with negative UPT - Discussed birth spacing of 18  months  4. Sleep and fatigue -Encouraged family/partner/community support of 4 hrs of uninterrupted sleep to help with mood and fatigue  5. Physical Recovery  - Discussed patients delivery and complications. She describes her labor as good. - Patient had a Vaginal, no problems at delivery. Patient had  no  laceration. Perineal healing reviewed. Patient expressed understanding - Patient has urinary incontinence? No. - Patient is safe to resume physical and sexual activity  6.  Health Maintenance - HM due items addressed Yes - Last pap smear  Diagnosis  Date Value Ref Range Status  01/05/2020   Final   - Benign reactive/reparative changes associated with intrauterine device  01/05/2020 (IUD)  Final   Pap smear not done at today's visit.  -Breast Cancer screening indicated? No.   7. Chronic Disease/Pregnancy Condition follow up: Hypertension and Gestational Diabetes - PCP referral   Vonzella Nipple, PA-C [facility] [employer]

## 2022-01-11 ENCOUNTER — Ambulatory Visit (INDEPENDENT_AMBULATORY_CARE_PROVIDER_SITE_OTHER): Payer: 59 | Admitting: Adult Health

## 2022-01-11 ENCOUNTER — Other Ambulatory Visit: Payer: 59

## 2022-01-11 ENCOUNTER — Encounter: Payer: Self-pay | Admitting: Adult Health

## 2022-01-11 VITALS — BP 128/82 | HR 103 | Ht 64.0 in | Wt 182.0 lb

## 2022-01-11 DIAGNOSIS — Z131 Encounter for screening for diabetes mellitus: Secondary | ICD-10-CM

## 2022-01-11 DIAGNOSIS — Z8632 Personal history of gestational diabetes: Secondary | ICD-10-CM

## 2022-01-11 DIAGNOSIS — Z30430 Encounter for insertion of intrauterine contraceptive device: Secondary | ICD-10-CM | POA: Diagnosis not present

## 2022-01-11 DIAGNOSIS — Z3202 Encounter for pregnancy test, result negative: Secondary | ICD-10-CM | POA: Diagnosis not present

## 2022-01-11 LAB — POCT URINE PREGNANCY: Preg Test, Ur: NEGATIVE

## 2022-01-11 MED ORDER — LEVONORGESTREL 20 MCG/DAY IU IUD
1.0000 | INTRAUTERINE_SYSTEM | Freq: Once | INTRAUTERINE | Status: AC
  Administered 2022-01-11: 1 via INTRAUTERINE

## 2022-01-11 NOTE — Addendum Note (Signed)
Addended by: Colen Darling on: 01/11/2022 12:17 PM   Modules accepted: Orders

## 2022-01-11 NOTE — Progress Notes (Signed)
Patient ID: Meghan Phelps, female   DOB: June 12, 1991, 31 y.o.   MRN: [id number]   IUD INSERTION Patient name: Meghan Phelps MRN [id number]  Date of birth: 04-02-1991 Subjective Findings:   Meghan Phelps is a 31 y.o. [phone] Caucasian female being seen today for insertion of a Mirena IUD.  Patient's last menstrual period was 12/28/2021. Last sexual intercourse was over 2 weeks ago. Last pap.    Lab Results  Component Value Date   DIAGPAP  01/05/2020    - Benign reactive/reparative changes associated with intrauterine device   DIAGPAP (IUD) 01/05/2020   HPV NOT DETECTED 10/12/2018   HPVHIGH Negative 01/05/2020     The risks and benefits of the method and placement have been thouroughly reviewed with the patient and all questions were answered.  Specifically the patient is aware of failure rate of 07/998, expulsion of the IUD and of possible perforation.  The patient is aware of irregular bleeding due to the method and understands the incidence of irregular bleeding diminishes with time.  Signed copy of informed consent in chart.      09/13/2021    8:17 PM 08/28/2021    9:10 AM 05/22/2021    9:25 AM 01/15/2021    8:37 AM 01/05/2020    9:43 AM  Depression screen PHQ 2/9  Decreased Interest 0 0 2 1 1   Down, Depressed, Hopeless 0 0 1 0 1  PHQ - 2 Score 0 0 3 1 2   Altered sleeping  1 3 1 2   Tired, decreased energy  1 3 1 1   Change in appetite  0 3 1 0  Feeling bad or failure about yourself   0 0 0 1  Trouble concentrating  0 0 0 0  Moving slowly or fidgety/restless  0 0 0 0  Suicidal thoughts  0 0 0 0  PHQ-9 Score  2 12 4 6   Difficult doing work/chores     Not difficult at all        08/28/2021    9:10 AM 05/22/2021    9:25 AM 01/15/2021    8:37 AM 01/05/2020    9:44 AM  GAD 7 : Generalized Anxiety Score  Nervous, Anxious, on Edge 1 2 0 1  Control/stop worrying 0 2 0 1  Worry too much - different things 0 2 1 1   Trouble relaxing 1 1 0 1  Restless 0 1 0 1  Easily annoyed or  irritable 1 2 1 1   Afraid - awful might happen 0 0 0 0  Total GAD 7 Score 3 10 2 6   Anxiety Difficulty    Not difficult at all     Pertinent History Reviewed:   Reviewed past medical,surgical, social, obstetrical and family history.  Reviewed problem list, medications and allergies. Objective Findings & Procedure:   Vitals:   01/11/22 0950  BP: 128/82  Pulse: (!) 103  Weight: 182 lb (82.6 kg)  Height: 5\' 4"  (1.626 m)  Body mass index is 31.24 kg/m.  Results for orders placed or performed in visit on 01/11/22 (from the past 24 hour(s))  POCT urine pregnancy   Collection Time: 01/11/22  9:55 AM  Result Value Ref Range   Preg Test, Ur Negative Negative     Time out was performed.  A Graves speculum was placed in the vagina.  The cervix was visualized, prepped using Betadine, and The uterus was found to be neutral and it sounded to 7 cm.  Mirena  IUD  placed per manufacturer's recommendations. The strings were trimmed to approximately 3 cm. The patient tolerated the procedure well.   Informal transvaginal sonogram was performed and the proper placement of the IUD was verified.  Chaperone: Malachy Mood   Assessment & Plan:   1) Mirena IUD insertion The patient was given post procedure instructions, including signs and symptoms of infection and to check for the strings after each menses or each month, and refraining from intercourse or anything in the vagina for 3 days. She was given a care card with date IUD placed, and date IUD to be removed. She is scheduled for a f/u appointment in 4 weeks.  Orders Placed This Encounter  Procedures   POCT urine pregnancy    Return in about 4 weeks (around 02/08/2022) for IUD check with me .  Cyril Mourning NP 01/11/2022 10:21 AM

## 2022-01-12 LAB — GLUCOSE TOLERANCE, 2 HOURS W/ 1HR
Glucose, 1 hour: 156 mg/dL (ref 70–179)
Glucose, 2 hour: 108 mg/dL (ref 70–152)
Glucose, Fasting: 85 mg/dL (ref 70–91)

## 2022-02-08 ENCOUNTER — Ambulatory Visit (INDEPENDENT_AMBULATORY_CARE_PROVIDER_SITE_OTHER): Payer: 59 | Admitting: Adult Health

## 2022-02-08 ENCOUNTER — Encounter: Payer: Self-pay | Admitting: Adult Health

## 2022-02-08 VITALS — BP 122/80 | HR 101 | Ht 64.0 in | Wt 182.0 lb

## 2022-02-08 DIAGNOSIS — Z30431 Encounter for routine checking of intrauterine contraceptive device: Secondary | ICD-10-CM | POA: Insufficient documentation

## 2022-02-08 NOTE — Progress Notes (Signed)
  Subjective:     Patient ID: Meghan Phelps, female   DOB: 1990/10/13, 31 y.o.   MRN: [id number]  HPI Meghan Phelps is a 31 year old white female,married, [id number] in for IUD check, has mirena placed 01/11/22.  Lab Results  Component Value Date   DIAGPAP  01/05/2020    - Benign reactive/reparative changes associated with intrauterine device   DIAGPAP (IUD) 01/05/2020   HPV NOT DETECTED 10/12/2018   HPVHIGH Negative 01/05/2020    Review of Systems Has felt strings once Reviewed past medical,surgical, social and family history. Reviewed medications and allergies.     Objective:   Physical Exam BP 122/80 (BP Location: Left Arm, Patient Position: Sitting, Cuff Size: Normal)   Pulse (!) 101   Ht 5\' 4"  (1.626 m)   Wt 182 lb (82.6 kg)   LMP 01/19/2022   Breastfeeding No   BMI 31.24 kg/m     Skin warm and dry.Pelvic: external genitalia is normal in appearance no lesions, vagina: white discharge without odor,urethra has no lesions or masses noted, cervix:smooth and bulbous,+IUD strings at os, uterus: normal size, shape and contour, non tender, no masses felt, adnexa: no masses or tenderness noted. Bladder is non tender and no masses felt.  Fall risk is low  Upstream - 02/08/22 0935       Pregnancy Intention Screening   Does the patient want to become pregnant in the next year? No    Does the patient's partner want to become pregnant in the next year? No    Would the patient like to discuss contraceptive options today? No      Contraception Wrap Up   Current Method IUD or IUS    End Method IUD or IUS            Examination chaperoned by 02/10/22 LPN  Assessment:     1. IUD check up +strings at os  Mirena placed 01/12/23    Plan:     Return about 01/07/23 for pap and physical

## 2022-03-04 DIAGNOSIS — H52222 Regular astigmatism, left eye: Secondary | ICD-10-CM | POA: Diagnosis not present

## 2022-03-04 DIAGNOSIS — H5213 Myopia, bilateral: Secondary | ICD-10-CM | POA: Diagnosis not present

## 2022-03-04 IMAGING — US US OB < 14 WEEKS - US OB TV
1 series · 13 of 28 positions shown · non-contrast
Comparison: None for this gestation

CLINICAL DATA: Vaginal bleeding in first trimester of pregnancy,
bleeding for 12 days, quantitative beta HCG 3,206 on 10/23/2020. LMP
08/31/2020

EXAM:
OBSTETRIC <14 WK US AND TRANSVAGINAL OB US
TECHNIQUE: Both transabdominal and transvaginal ultrasound examinations were
performed for complete evaluation of the gestation as well as the
maternal uterus, adnexal regions, and pelvic cul-de-sac.
Transvaginal technique was performed to assess early pregnancy.

[Series 1: us ob less than 14 weeks with ob transvaginal · 131 acquisitions, 13 frames shown]
[im 5/131]
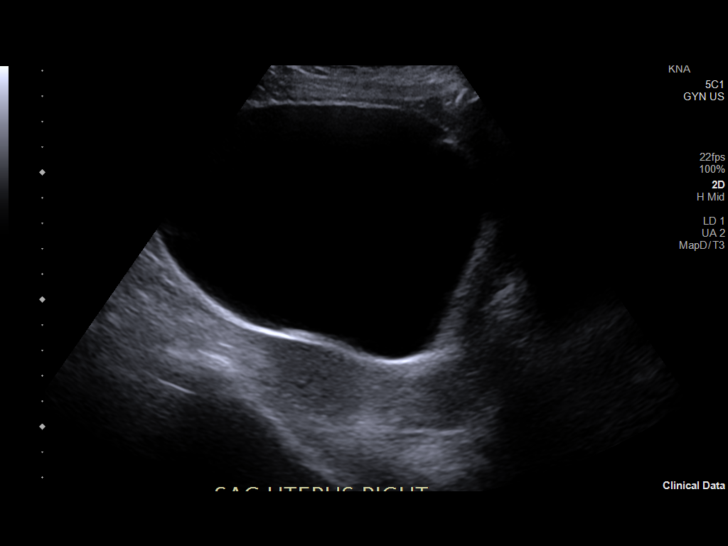
[im 15/131]
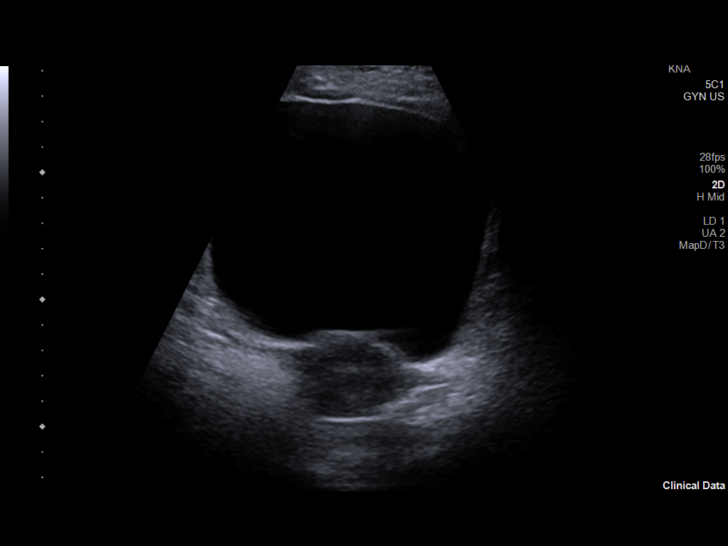
[im 25/131]
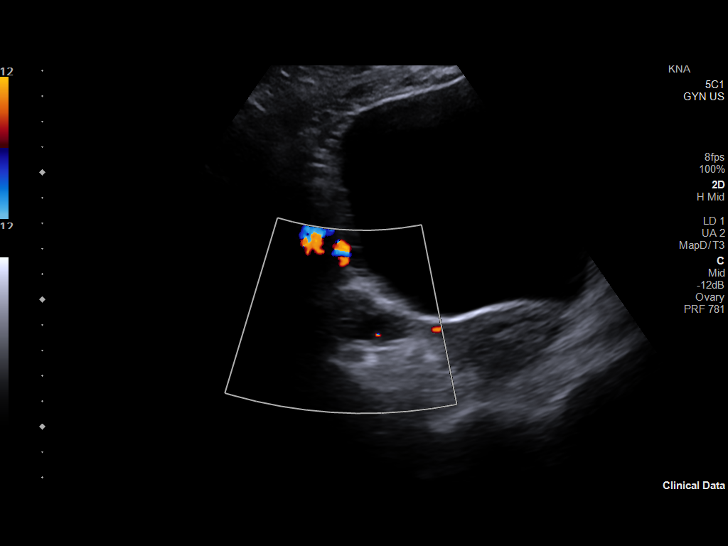
[im 34/131]
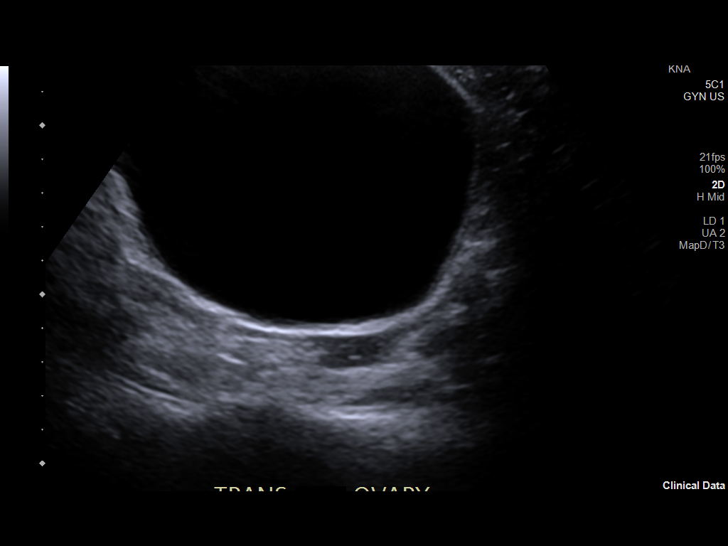
[im 44/131]
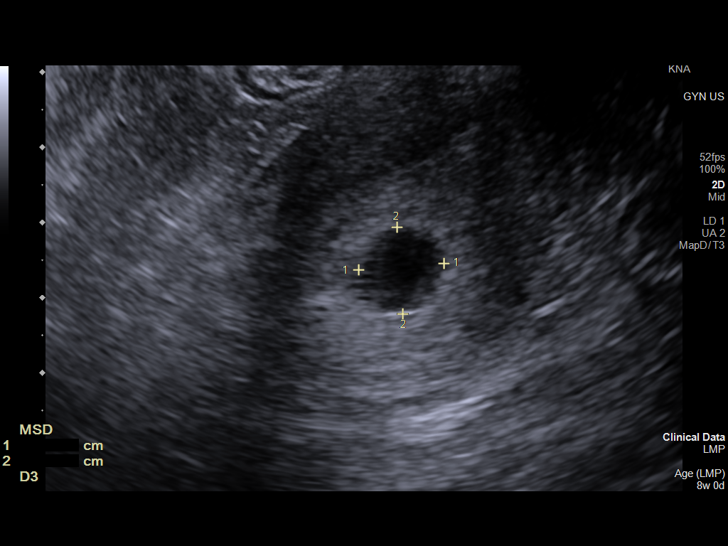
[im 53/131]
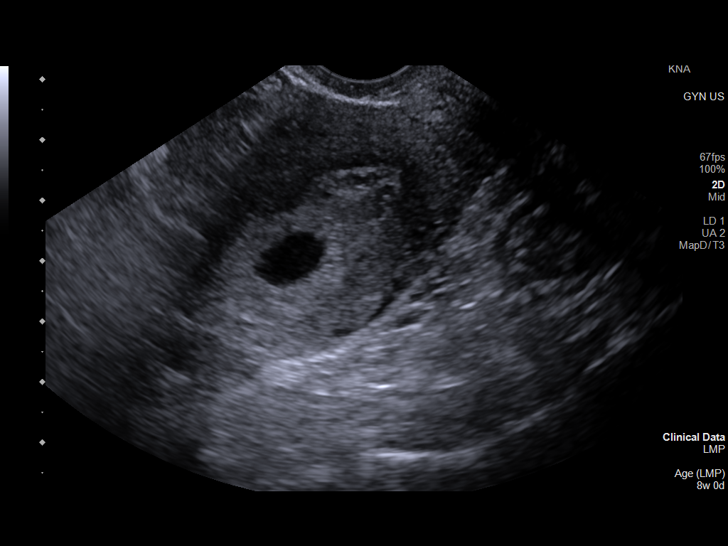
[im 68/131]
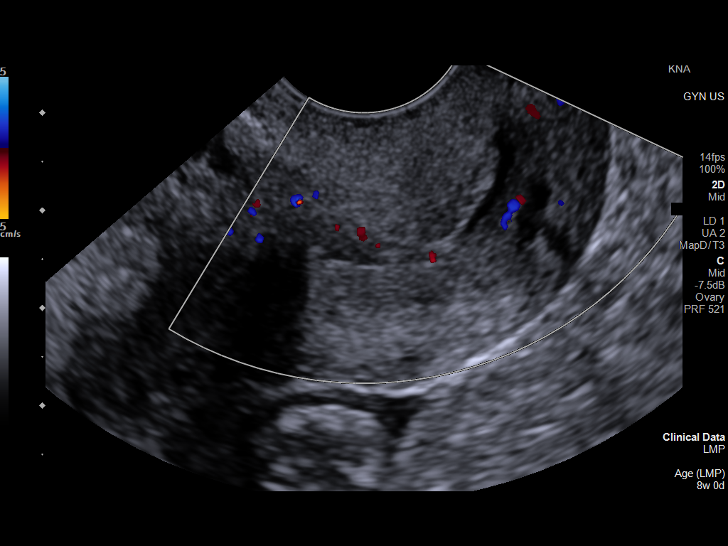
[im 78/131]
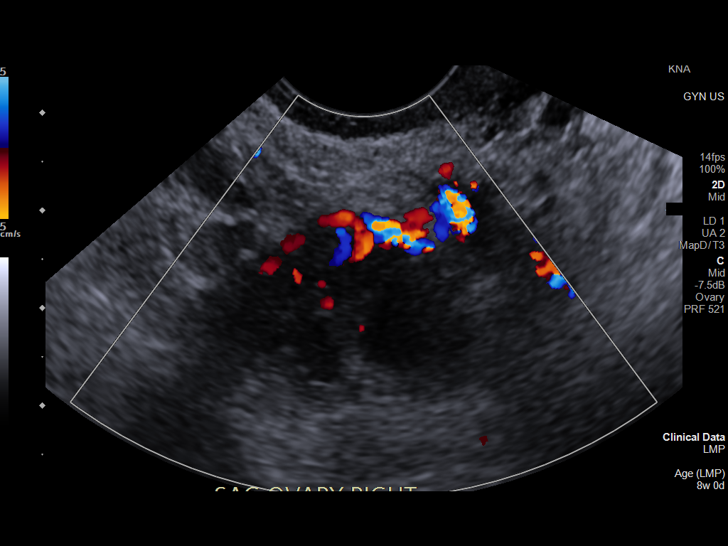
[im 87/131]
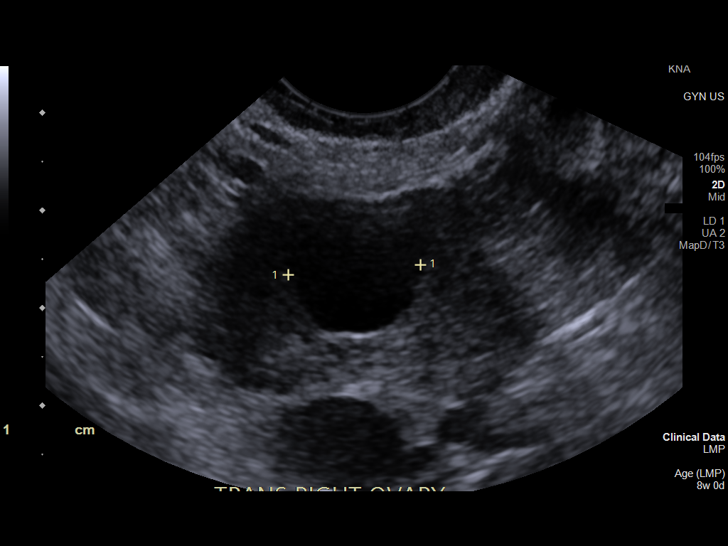
[im 97/131]
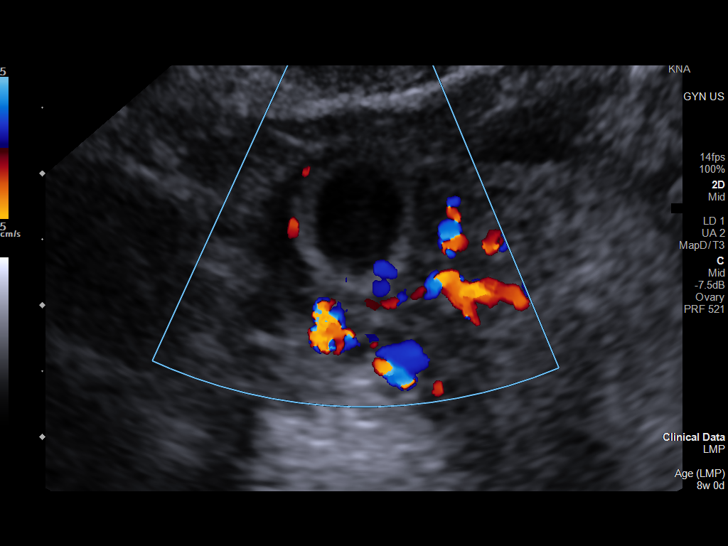
[im 106/131]
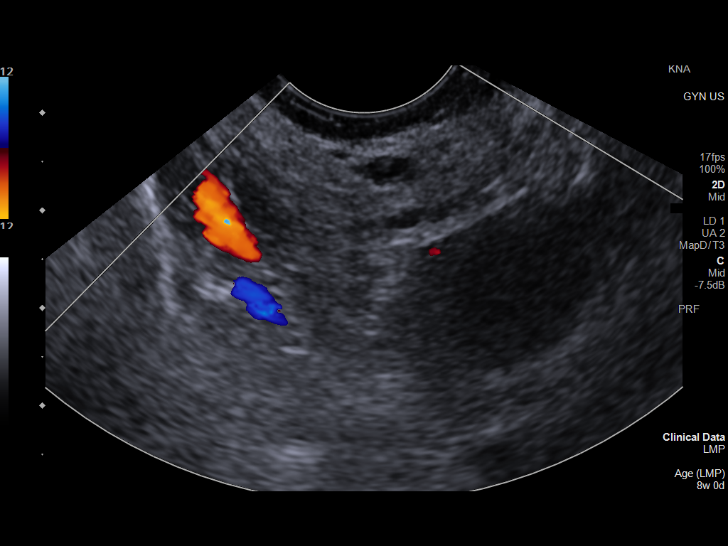
[im 116/131]
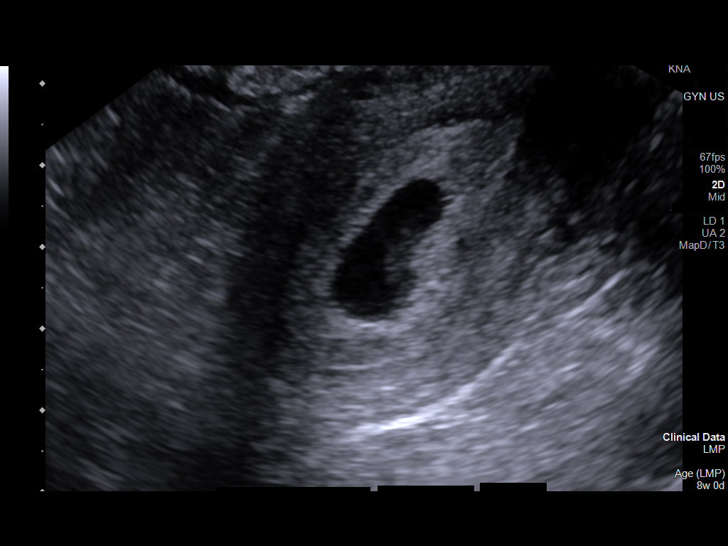
[im 126/131]
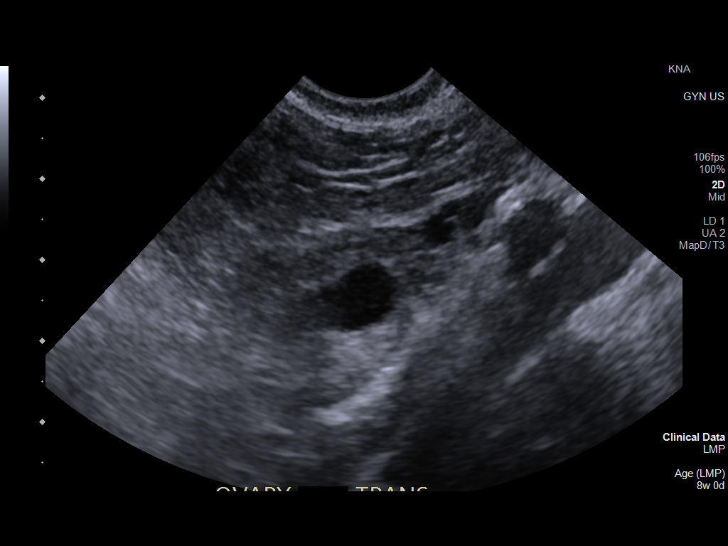

[13 of 28 positions shown; findings below may reference images not displayed]

FINDINGS: Intrauterine gestational sac: Present, single

Yolk sac:  Not identified

Embryo:  Not identified

Cardiac Activity: N/A

Heart Rate: N/A  bpm

MSD: 10.6 mm   5 w   6 d

CRL:    mm    w    d                  US EDC:

Subchorionic hemorrhage:  Small subchronic hemorrhage

Maternal uterus/adnexae:

Remainder of uterus unremarkable.

RIGHT ovary measures 2.3 x 1.9 x 2.7 cm and contains a small corpus
luteal cyst.

LEFT ovary normal size and morphology 2.8 x 1.5 x 2.5 cm.

Trace free pelvic fluid.

A small anechoic collection 15 x 9 x 9 mm is seen in the RIGHT
adnexa, with a surrounding mildly hyperechoic rim; this is of
uncertain etiology.
IMPRESSION: Gestational sac within the uterus at 5 weeks 6 days EGA by mean sac
diameter.

Small subchronic hemorrhage.

No fetal pole identified to establish viability; may consider
follow-up ultrasound in 14 days to confirm viability if clinically
indicated.

Small anechoic collection in the RIGHT adnexa 15 x 9 x 9 mm with
surrounding slightly hyperechoic rim, question exophytic ovarian
versus paraovarian cyst, short segment of hydrosalpinx, coexistent
ectopic pregnancy very rare but not entirely excluded; short-term
follow-up ultrasound recommended to reassess.

These results will be called to the ordering clinician or
representative by the Radiologist Assistant, and communication
documented in the PACS or [REDACTED].

## 2022-12-04 DIAGNOSIS — F411 Generalized anxiety disorder: Secondary | ICD-10-CM | POA: Diagnosis not present

## 2022-12-04 DIAGNOSIS — Z6831 Body mass index (BMI) 31.0-31.9, adult: Secondary | ICD-10-CM | POA: Diagnosis not present

## 2022-12-04 DIAGNOSIS — F331 Major depressive disorder, recurrent, moderate: Secondary | ICD-10-CM | POA: Diagnosis not present

## 2023-01-08 DIAGNOSIS — Z6830 Body mass index (BMI) 30.0-30.9, adult: Secondary | ICD-10-CM | POA: Diagnosis not present

## 2023-01-08 DIAGNOSIS — F411 Generalized anxiety disorder: Secondary | ICD-10-CM | POA: Diagnosis not present

## 2023-01-08 DIAGNOSIS — F331 Major depressive disorder, recurrent, moderate: Secondary | ICD-10-CM | POA: Diagnosis not present

## 2023-04-09 DIAGNOSIS — Z1329 Encounter for screening for other suspected endocrine disorder: Secondary | ICD-10-CM | POA: Diagnosis not present

## 2023-04-09 DIAGNOSIS — Z131 Encounter for screening for diabetes mellitus: Secondary | ICD-10-CM | POA: Diagnosis not present

## 2023-04-09 DIAGNOSIS — Z6828 Body mass index (BMI) 28.0-28.9, adult: Secondary | ICD-10-CM | POA: Diagnosis not present

## 2023-04-09 DIAGNOSIS — F331 Major depressive disorder, recurrent, moderate: Secondary | ICD-10-CM | POA: Diagnosis not present

## 2023-04-09 DIAGNOSIS — Z13220 Encounter for screening for lipoid disorders: Secondary | ICD-10-CM | POA: Diagnosis not present

## 2023-04-09 DIAGNOSIS — F411 Generalized anxiety disorder: Secondary | ICD-10-CM | POA: Diagnosis not present

## 2023-04-09 DIAGNOSIS — Z0000 Encounter for general adult medical examination without abnormal findings: Secondary | ICD-10-CM | POA: Diagnosis not present

## 2023-04-18 DIAGNOSIS — H5213 Myopia, bilateral: Secondary | ICD-10-CM | POA: Diagnosis not present

## 2023-04-18 DIAGNOSIS — H52222 Regular astigmatism, left eye: Secondary | ICD-10-CM | POA: Diagnosis not present

## 2023-05-23 DIAGNOSIS — Z6828 Body mass index (BMI) 28.0-28.9, adult: Secondary | ICD-10-CM | POA: Diagnosis not present

## 2023-05-23 DIAGNOSIS — F411 Generalized anxiety disorder: Secondary | ICD-10-CM | POA: Diagnosis not present

## 2023-05-23 DIAGNOSIS — F331 Major depressive disorder, recurrent, moderate: Secondary | ICD-10-CM | POA: Diagnosis not present

## 2023-11-24 DIAGNOSIS — Z6829 Body mass index (BMI) 29.0-29.9, adult: Secondary | ICD-10-CM | POA: Diagnosis not present

## 2023-11-24 DIAGNOSIS — F331 Major depressive disorder, recurrent, moderate: Secondary | ICD-10-CM | POA: Diagnosis not present

## 2023-11-24 DIAGNOSIS — F411 Generalized anxiety disorder: Secondary | ICD-10-CM | POA: Diagnosis not present

## 2023-11-24 DIAGNOSIS — G44209 Tension-type headache, unspecified, not intractable: Secondary | ICD-10-CM | POA: Diagnosis not present

## 2024-02-20 ENCOUNTER — Telehealth: Admitting: Physician Assistant

## 2024-02-20 DIAGNOSIS — L02211 Cutaneous abscess of abdominal wall: Secondary | ICD-10-CM

## 2024-02-20 MED ORDER — SULFAMETHOXAZOLE-TRIMETHOPRIM 800-160 MG PO TABS: 1.0000 | ORAL_TABLET | Freq: Two times a day (BID) | ORAL | 0 refills | Status: DC

## 2024-02-20 NOTE — Progress Notes (Signed)

## 2024-04-12 DIAGNOSIS — Z0000 Encounter for general adult medical examination without abnormal findings: Secondary | ICD-10-CM | POA: Diagnosis not present

## 2024-04-12 DIAGNOSIS — Z6829 Body mass index (BMI) 29.0-29.9, adult: Secondary | ICD-10-CM | POA: Diagnosis not present

## 2024-04-12 DIAGNOSIS — F411 Generalized anxiety disorder: Secondary | ICD-10-CM | POA: Diagnosis not present

## 2024-04-12 DIAGNOSIS — F331 Major depressive disorder, recurrent, moderate: Secondary | ICD-10-CM | POA: Diagnosis not present

## 2024-04-12 DIAGNOSIS — Z13220 Encounter for screening for lipoid disorders: Secondary | ICD-10-CM | POA: Diagnosis not present

## 2024-05-26 ENCOUNTER — Telehealth: Admitting: Physician Assistant

## 2024-05-26 DIAGNOSIS — H6992 Unspecified Eustachian tube disorder, left ear: Secondary | ICD-10-CM | POA: Diagnosis not present

## 2024-05-26 MED ORDER — FLUTICASONE PROPIONATE 50 MCG/ACT NA SUSP: 2.0000 | Freq: Every day | NASAL | 0 refills | Status: AC

## 2024-05-26 NOTE — Progress Notes (Signed)

## 2024-06-09 DIAGNOSIS — H5213 Myopia, bilateral: Secondary | ICD-10-CM | POA: Diagnosis not present
